# Patient Record
Sex: Female | Born: 1985 | Race: Black or African American | Hispanic: No | Marital: Single | Smoking: Current every day smoker
Health system: Southern US, Community
[De-identification: ages and names within clinical notes are randomized; demographics above are authoritative.]

## PROBLEM LIST (undated history)

## (undated) DIAGNOSIS — F1920 Other psychoactive substance dependence, uncomplicated: Secondary | ICD-10-CM

## (undated) HISTORY — PX: TONSILLECTOMY: SUR1361

## (undated) HISTORY — PX: OTHER SURGICAL HISTORY: SHX169

---
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
## 2005-04-03 ENCOUNTER — Observation Stay: Payer: Self-pay | Admitting: Obstetrics & Gynecology

## 2005-04-11 ENCOUNTER — Observation Stay: Payer: Self-pay | Admitting: Unknown Physician Specialty

## 2005-05-10 ENCOUNTER — Observation Stay: Payer: Self-pay

## 2005-08-26 ENCOUNTER — Emergency Department: Payer: Self-pay | Admitting: Emergency Medicine

## 2006-02-08 ENCOUNTER — Emergency Department: Payer: Self-pay | Admitting: Emergency Medicine

## 2006-08-22 ENCOUNTER — Emergency Department: Payer: Self-pay | Admitting: Emergency Medicine

## 2006-09-08 ENCOUNTER — Emergency Department: Payer: Self-pay | Admitting: Emergency Medicine

## 2006-11-01 ENCOUNTER — Encounter: Payer: Self-pay | Admitting: Maternal and Fetal Medicine

## 2006-11-22 ENCOUNTER — Encounter: Payer: Self-pay | Admitting: Maternal & Fetal Medicine

## 2007-01-06 ENCOUNTER — Observation Stay: Payer: Self-pay

## 2007-01-21 ENCOUNTER — Encounter: Payer: Self-pay | Admitting: Maternal & Fetal Medicine

## 2007-02-06 ENCOUNTER — Observation Stay: Payer: Self-pay | Admitting: Obstetrics and Gynecology

## 2007-02-18 ENCOUNTER — Encounter: Payer: Self-pay | Admitting: Maternal and Fetal Medicine

## 2007-03-09 ENCOUNTER — Observation Stay: Payer: Self-pay

## 2007-03-13 ENCOUNTER — Observation Stay: Payer: Self-pay | Admitting: Obstetrics and Gynecology

## 2007-03-19 ENCOUNTER — Observation Stay: Payer: Self-pay | Admitting: Unknown Physician Specialty

## 2007-03-21 ENCOUNTER — Observation Stay: Payer: Self-pay

## 2007-04-17 ENCOUNTER — Ambulatory Visit: Payer: Self-pay | Admitting: Obstetrics & Gynecology

## 2007-04-18 ENCOUNTER — Inpatient Hospital Stay: Payer: Self-pay | Admitting: Obstetrics & Gynecology

## 2007-11-16 ENCOUNTER — Ambulatory Visit: Payer: Self-pay | Admitting: Family Medicine

## 2008-02-28 ENCOUNTER — Inpatient Hospital Stay: Payer: Self-pay | Admitting: Psychiatry

## 2008-10-21 ENCOUNTER — Emergency Department: Payer: Self-pay | Admitting: Emergency Medicine

## 2009-01-28 ENCOUNTER — Encounter: Payer: Self-pay | Admitting: Obstetrics and Gynecology

## 2009-02-10 ENCOUNTER — Encounter: Payer: Self-pay | Admitting: Obstetrics and Gynecology

## 2009-06-29 ENCOUNTER — Observation Stay: Payer: Self-pay

## 2009-06-30 ENCOUNTER — Observation Stay: Payer: Self-pay

## 2009-08-20 ENCOUNTER — Inpatient Hospital Stay: Payer: Self-pay

## 2009-10-09 ENCOUNTER — Ambulatory Visit: Payer: Self-pay

## 2011-04-01 ENCOUNTER — Emergency Department: Payer: Self-pay | Admitting: Emergency Medicine

## 2012-04-05 LAB — URINALYSIS, COMPLETE
Bilirubin,UR: NEGATIVE
Ph: 5 (ref 4.5–8.0)
Protein: 30
Specific Gravity: 1.032 (ref 1.003–1.030)
Squamous Epithelial: 27

## 2012-04-05 LAB — BASIC METABOLIC PANEL
Co2: 23 mmol/L (ref 21–32)
Creatinine: 1.15 mg/dL (ref 0.60–1.30)
EGFR (African American): >60
EGFR (Non-African Amer.): >60
Glucose: 100 mg/dL — ABNORMAL HIGH (ref 65–99)
Sodium: 138 mmol/L (ref 136–145)

## 2012-04-05 LAB — CSF CELL CT + PROT + GLU PANEL
CSF Tube #: 3
Eosinophil: 0 %
Glucose, CSF: 59 mg/dL (ref 40–75)
Lymphocytes: 67 %
Monocytes/Macrophages: 17 %
Other Cells: 0 %
RBC (CSF): 20 /mm3

## 2012-04-05 LAB — CBC
HCT: 43.7 % (ref 35.0–47.0)
HGB: 14.8 g/dL (ref 12.0–16.0)
MCH: 29.5 pg (ref 26.0–34.0)
MCHC: 33.8 g/dL (ref 32.0–36.0)
MCV: 87 fL (ref 80–100)
RBC: 5.01 10*6/uL (ref 3.80–5.20)
RDW: 15.1 % — ABNORMAL HIGH (ref 11.5–14.5)
WBC: 5.6 10*3/uL (ref 3.6–11.0)

## 2012-04-05 LAB — MONONUCLEOSIS SCREEN: Mono Test: POSITIVE

## 2012-04-06 ENCOUNTER — Inpatient Hospital Stay: Payer: Self-pay | Admitting: Family Medicine

## 2012-04-06 ENCOUNTER — Ambulatory Visit: Payer: Self-pay | Admitting: Neurology

## 2012-04-06 LAB — URINALYSIS, COMPLETE
Bacteria: NONE SEEN
Bilirubin,UR: NEGATIVE
Glucose,UR: NEGATIVE mg/dL (ref 0–75)
RBC,UR: 13 /HPF (ref 0–5)
Specific Gravity: 1.018 (ref 1.003–1.030)
Squamous Epithelial: 4
WBC UR: 24 /HPF (ref 0–5)

## 2012-04-07 LAB — CBC WITH DIFFERENTIAL/PLATELET
Basophil #: 0 10*3/uL (ref 0.0–0.1)
Basophil %: 0.7 %
HGB: 12.9 g/dL (ref 12.0–16.0)
Lymphocyte #: 1.5 10*3/uL (ref 1.0–3.6)
MCH: 29 pg (ref 26.0–34.0)
MCHC: 33.6 g/dL (ref 32.0–36.0)
Monocyte #: 0.5 x10 3/mm (ref 0.2–0.9)
Monocyte %: 11.1 %
Neutrophil #: 2.8 10*3/uL (ref 1.4–6.5)
Platelet: 189 10*3/uL (ref 150–440)
RDW: 14.4 % (ref 11.5–14.5)

## 2012-04-07 LAB — COMPREHENSIVE METABOLIC PANEL
Albumin: 2.7 g/dL — ABNORMAL LOW (ref 3.4–5.0)
Anion Gap: 9 (ref 7–16)
Calcium, Total: 8.1 mg/dL — ABNORMAL LOW (ref 8.5–10.1)
Co2: 22 mmol/L (ref 21–32)
Creatinine: 1.42 mg/dL — ABNORMAL HIGH (ref 0.60–1.30)
EGFR (African American): 59 — ABNORMAL LOW
EGFR (Non-African Amer.): 51 — ABNORMAL LOW
Glucose: 105 mg/dL — ABNORMAL HIGH (ref 65–99)
Osmolality: 275 (ref 275–301)
Potassium: 3 mmol/L — ABNORMAL LOW (ref 3.5–5.1)
SGOT(AST): 55 U/L — ABNORMAL HIGH (ref 15–37)
SGPT (ALT): 45 U/L (ref 12–78)
Sodium: 139 mmol/L (ref 136–145)

## 2012-04-07 LAB — URINE CULTURE

## 2012-04-07 LAB — MAGNESIUM: Magnesium: 1.9 mg/dL

## 2012-04-07 LAB — BETA STREP CULTURE(ARMC)

## 2012-04-08 LAB — BASIC METABOLIC PANEL
Anion Gap: 8 (ref 7–16)
BUN: 3 mg/dL — ABNORMAL LOW (ref 7–18)
Chloride: 108 mmol/L — ABNORMAL HIGH (ref 98–107)
Co2: 23 mmol/L (ref 21–32)
EGFR (Non-African Amer.): 54 — ABNORMAL LOW
Glucose: 89 mg/dL (ref 65–99)
Osmolality: 274 (ref 275–301)

## 2012-04-08 LAB — CSF CULTURE W GRAM STAIN

## 2012-04-10 LAB — URINALYSIS, COMPLETE
Glucose,UR: NEGATIVE mg/dL (ref 0–75)
Leukocyte Esterase: NEGATIVE
Nitrite: NEGATIVE
Protein: NEGATIVE
RBC,UR: 1 /HPF (ref 0–5)
Specific Gravity: 1.005 (ref 1.003–1.030)
WBC UR: 1 /HPF (ref 0–5)

## 2012-04-10 LAB — CBC WITH DIFFERENTIAL/PLATELET
Basophil %: 0.7 %
Eosinophil #: 0 10*3/uL (ref 0.0–0.7)
HCT: 34.8 % — ABNORMAL LOW (ref 35.0–47.0)
HGB: 12.2 g/dL (ref 12.0–16.0)
MCH: 29.8 pg (ref 26.0–34.0)
MCHC: 35 g/dL (ref 32.0–36.0)
Monocyte #: 0.5 x10 3/mm (ref 0.2–0.9)
Neutrophil %: 64.9 %
Platelet: 273 10*3/uL (ref 150–440)
RBC: 4.09 10*6/uL (ref 3.80–5.20)

## 2012-04-10 LAB — BASIC METABOLIC PANEL
Anion Gap: 10 (ref 7–16)
BUN: 1 mg/dL — ABNORMAL LOW (ref 7–18)
Co2: 23 mmol/L (ref 21–32)
Creatinine: 1.07 mg/dL (ref 0.60–1.30)
EGFR (African American): >60
EGFR (Non-African Amer.): >60
Glucose: 89 mg/dL (ref 65–99)
Osmolality: 271 (ref 275–301)
Sodium: 138 mmol/L (ref 136–145)

## 2012-04-11 LAB — URINALYSIS, COMPLETE
Bacteria: NONE SEEN
Glucose,UR: NEGATIVE mg/dL (ref 0–75)
Ketone: NEGATIVE
Nitrite: NEGATIVE
RBC,UR: 1 /HPF (ref 0–5)
Specific Gravity: 1.005 (ref 1.003–1.030)
WBC UR: 1 /HPF (ref 0–5)

## 2012-04-11 LAB — CBC WITH DIFFERENTIAL/PLATELET
Basophil %: 1.2 %
Eosinophil #: 0.1 10*3/uL (ref 0.0–0.7)
Eosinophil %: 1.6 %
HGB: 12.9 g/dL (ref 12.0–16.0)
Lymphocyte %: 33.8 %
MCH: 29.8 pg (ref 26.0–34.0)
MCHC: 35.4 g/dL (ref 32.0–36.0)
MCV: 84 fL (ref 80–100)
Monocyte #: 0.6 x10 3/mm (ref 0.2–0.9)
Monocyte %: 12.3 %
Neutrophil %: 51.1 %
Platelet: 331 10*3/uL (ref 150–440)
RBC: 4.34 10*6/uL (ref 3.80–5.20)
WBC: 4.9 10*3/uL (ref 3.6–11.0)

## 2012-04-17 LAB — CULTURE, BLOOD (SINGLE)

## 2013-03-30 ENCOUNTER — Emergency Department: Payer: Self-pay | Admitting: Emergency Medicine

## 2013-03-30 LAB — CBC
HGB: 14.7 g/dL (ref 12.0–16.0)
MCH: 30.7 pg (ref 26.0–34.0)
Platelet: 273 10*3/uL (ref 150–440)
RDW: 16.8 % — ABNORMAL HIGH (ref 11.5–14.5)

## 2013-03-30 LAB — COMPREHENSIVE METABOLIC PANEL
BUN: 6 mg/dL — ABNORMAL LOW (ref 7–18)
Calcium, Total: 8.9 mg/dL (ref 8.5–10.1)
Co2: 23 mmol/L (ref 21–32)
EGFR (Non-African Amer.): >60
Osmolality: 272 (ref 275–301)
Potassium: 3.5 mmol/L (ref 3.5–5.1)
Sodium: 138 mmol/L (ref 136–145)

## 2013-03-30 LAB — CK TOTAL AND CKMB (NOT AT ARMC)
CK, Total: 92 U/L (ref 21–215)
CK-MB: <0.5 ng/mL — ABNORMAL LOW (ref 0.5–3.6)

## 2013-04-17 ENCOUNTER — Inpatient Hospital Stay: Payer: Self-pay | Admitting: Psychiatry

## 2013-04-17 LAB — CBC
HCT: 41.3 % (ref 35.0–47.0)
MCH: 31 pg (ref 26.0–34.0)
MCV: 89 fL (ref 80–100)
RDW: 16.7 % — ABNORMAL HIGH (ref 11.5–14.5)

## 2013-04-17 LAB — DRUG SCREEN, URINE
Benzodiazepine, Ur Scrn: NEGATIVE (ref ?–200)
Methadone, Ur Screen: NEGATIVE (ref ?–300)
Phencyclidine (PCP) Ur S: NEGATIVE (ref ?–25)

## 2013-04-17 LAB — URINALYSIS, COMPLETE
Glucose,UR: NEGATIVE mg/dL (ref 0–75)
Ketone: NEGATIVE
Nitrite: NEGATIVE
Protein: NEGATIVE
Specific Gravity: 1.017 (ref 1.003–1.030)

## 2013-04-17 LAB — COMPREHENSIVE METABOLIC PANEL
Alkaline Phosphatase: 90 U/L (ref 50–136)
Anion Gap: 5 — ABNORMAL LOW (ref 7–16)
Bilirubin,Total: 0.5 mg/dL (ref 0.2–1.0)
Chloride: 104 mmol/L (ref 98–107)
Co2: 28 mmol/L (ref 21–32)
Creatinine: 1.12 mg/dL (ref 0.60–1.30)
Osmolality: 271 (ref 275–301)
Potassium: 3.6 mmol/L (ref 3.5–5.1)
SGPT (ALT): 103 U/L — ABNORMAL HIGH (ref 12–78)
Sodium: 137 mmol/L (ref 136–145)
Total Protein: 7.7 g/dL (ref 6.4–8.2)

## 2013-04-17 LAB — ETHANOL: Ethanol: <3 mg/dL

## 2013-04-20 LAB — HEPATIC FUNCTION PANEL A (ARMC)
Alkaline Phosphatase: 65 U/L (ref 50–136)
Bilirubin, Direct: 0.1 mg/dL (ref 0.00–0.20)
Bilirubin,Total: 0.3 mg/dL (ref 0.2–1.0)
SGOT(AST): 34 U/L (ref 15–37)

## 2013-08-05 ENCOUNTER — Emergency Department: Payer: Self-pay | Admitting: Emergency Medicine

## 2013-08-05 LAB — URINALYSIS, COMPLETE
BUDDING YEAST: NONE SEEN
Glucose,UR: NEGATIVE mg/dL (ref 0–75)
Ketone: NEGATIVE
Leukocyte Esterase: NEGATIVE
Nitrite: NEGATIVE
PROTEIN: NEGATIVE
Ph: 6 (ref 4.5–8.0)
SPECIFIC GRAVITY: 1.017 (ref 1.003–1.030)
Squamous Epithelial: NONE SEEN
WBC Clump: NONE SEEN
WBC UR: 9 /HPF (ref 0–5)

## 2013-08-05 LAB — COMPREHENSIVE METABOLIC PANEL
ANION GAP: 3 — AB (ref 7–16)
AST: 27 U/L (ref 15–37)
Albumin: 3.9 g/dL (ref 3.4–5.0)
Alkaline Phosphatase: 50 U/L
BUN: 9 mg/dL (ref 7–18)
Bilirubin,Total: 0.3 mg/dL (ref 0.2–1.0)
CHLORIDE: 104 mmol/L (ref 98–107)
CO2: 30 mmol/L (ref 21–32)
CREATININE: 1.09 mg/dL (ref 0.60–1.30)
Calcium, Total: 9.1 mg/dL (ref 8.5–10.1)
Glucose: 78 mg/dL (ref 65–99)
Osmolality: 271 (ref 275–301)
Potassium: 3.7 mmol/L (ref 3.5–5.1)
SGPT (ALT): 25 U/L (ref 12–78)
Sodium: 137 mmol/L (ref 136–145)
Total Protein: 8 g/dL (ref 6.4–8.2)

## 2013-08-05 LAB — CBC WITH DIFFERENTIAL/PLATELET
Basophil #: 0.1 10*3/uL (ref 0.0–0.1)
Basophil %: 1.3 %
Eosinophil #: 0.1 10*3/uL (ref 0.0–0.7)
Eosinophil %: 1.4 %
HCT: 44.3 % (ref 35.0–47.0)
HGB: 15.3 g/dL (ref 12.0–16.0)
LYMPHS ABS: 2.2 10*3/uL (ref 1.0–3.6)
Lymphocyte %: 30 %
MCH: 31.7 pg (ref 26.0–34.0)
MCHC: 34.5 g/dL (ref 32.0–36.0)
MCV: 92 fL (ref 80–100)
MONO ABS: 0.6 x10 3/mm (ref 0.2–0.9)
Monocyte %: 7.5 %
Neutrophil #: 4.4 10*3/uL (ref 1.4–6.5)
Neutrophil %: 59.8 %
Platelet: 239 10*3/uL (ref 150–440)
RBC: 4.81 10*6/uL (ref 3.80–5.20)
RDW: 14.7 % — ABNORMAL HIGH (ref 11.5–14.5)
WBC: 7.3 10*3/uL (ref 3.6–11.0)

## 2013-08-05 LAB — LIPASE, BLOOD: LIPASE: 94 U/L (ref 73–393)

## 2013-08-27 ENCOUNTER — Emergency Department: Payer: Self-pay | Admitting: Emergency Medicine

## 2013-08-27 LAB — COMPREHENSIVE METABOLIC PANEL
ALK PHOS: 45 U/L
ALT: 17 U/L (ref 12–78)
ANION GAP: 11 (ref 7–16)
Albumin: 3.8 g/dL (ref 3.4–5.0)
BILIRUBIN TOTAL: 0.3 mg/dL (ref 0.2–1.0)
BUN: 5 mg/dL — ABNORMAL LOW (ref 7–18)
Calcium, Total: 8.2 mg/dL — ABNORMAL LOW (ref 8.5–10.1)
Chloride: 105 mmol/L (ref 98–107)
Co2: 22 mmol/L (ref 21–32)
Creatinine: 1.08 mg/dL (ref 0.60–1.30)
Glucose: 74 mg/dL (ref 65–99)
Osmolality: 272 (ref 275–301)
Potassium: 3.4 mmol/L — ABNORMAL LOW (ref 3.5–5.1)
SGOT(AST): 29 U/L (ref 15–37)
SODIUM: 138 mmol/L (ref 136–145)
Total Protein: 7.4 g/dL (ref 6.4–8.2)

## 2013-08-27 LAB — ACETAMINOPHEN LEVEL: Acetaminophen: <2 ug/mL

## 2013-08-27 LAB — DRUG SCREEN, URINE
Amphetamines, Ur Screen: NEGATIVE (ref ?–1000)
BARBITURATES, UR SCREEN: NEGATIVE (ref ?–200)
Benzodiazepine, Ur Scrn: NEGATIVE (ref ?–200)
Cannabinoid 50 Ng, Ur ~~LOC~~: POSITIVE (ref ?–50)
Cocaine Metabolite,Ur ~~LOC~~: POSITIVE (ref ?–300)
MDMA (Ecstasy)Ur Screen: NEGATIVE (ref ?–500)
Methadone, Ur Screen: NEGATIVE (ref ?–300)
OPIATE, UR SCREEN: NEGATIVE (ref ?–300)
Phencyclidine (PCP) Ur S: NEGATIVE (ref ?–25)
Tricyclic, Ur Screen: NEGATIVE (ref ?–1000)

## 2013-08-27 LAB — CBC
HCT: 44.2 % (ref 35.0–47.0)
HGB: 15 g/dL (ref 12.0–16.0)
MCH: 31.4 pg (ref 26.0–34.0)
MCHC: 33.9 g/dL (ref 32.0–36.0)
MCV: 93 fL (ref 80–100)
PLATELETS: 241 10*3/uL (ref 150–440)
RBC: 4.77 10*6/uL (ref 3.80–5.20)
RDW: 15.4 % — AB (ref 11.5–14.5)
WBC: 7.1 10*3/uL (ref 3.6–11.0)

## 2013-08-27 LAB — SALICYLATE LEVEL: Salicylates, Serum: 2.7 mg/dL

## 2013-08-27 LAB — ETHANOL
Ethanol %: 0.191 % — ABNORMAL HIGH (ref 0.000–0.080)
Ethanol: 191 mg/dL

## 2014-09-29 NOTE — H&P (Signed)
PATIENT NAME:  Angel Ortiz, Angel Ortiz MR#:  [id number] DATE OF BIRTH:  [birth year]  DATE OF ADMISSION:  04/06/2012  REFERRING PHYSICIAN: Dr. Clemens Catholic  PRIMARY CARE PHYSICIAN: None  HISTORY OF PRESENT ILLNESS: This is a 29 year old female without significant past medical history who presents with complaints of fever, chills, generalized body aches, headache, and neck tenderness over the last few days. The patient was febrile to 103.9 in the Emergency Department, was tachycardic and hypotensive initially. The patient had positive meningeal signs so the patient had an LP done by interventional radiology under fluoroscopy with CSF showing 59 white blood cells, 67% lymphocytes, with glucose of 59 and protein of 33. As well the patient's mono test came back positive. The patient denies any sick contacts. She denies any sensitivity to light. Denies any vomiting but complains of nausea, neck pain and stiffness, and generalized weakness without any focal deficits, tingling, numbness, or dizziness. The patient's CT of the brain did not show any acute findings. The patient was hypotensive where she did require fluid boluses. On my examination blood pressure was 76/40. The patient so far received 4 liters of normal saline fluid boluses. The patient was started on IV acyclovir for aseptic meningitis empirically.   PAST MEDICAL HISTORY: Depression.   PAST SURGICAL HISTORY: C-section.   HOME MEDICATIONS: None.   FAMILY HISTORY: The patient is adopted and does not know if there is any family history in her biological parents.   SOCIAL HISTORY: The patient is unemployed, smoking 1 pack per day. Denies any alcohol or drug abuse.   REVIEW OF SYSTEMS:  The patient complains of fever, fatigue, and weakness. EYES: Denies blurry vision, double vision, or pain. ENT: Denies tinnitus, ear pain, or hearing loss.  RESPIRATORY: Denies cough, wheezing, hemoptysis, dyspnea, or painful respirations. CARDIOVASCULAR:  Denies chest pain,  orthopnea, edema, arrhythmia, or palpitation.  GI: Complains of nausea. Denies vomiting, diarrhea, abdominal pain, or hematemesis.  GU: Denies dysuria, hematuria, or renal colic. ENDO: Denies polyuria, polydipsia, or heat or cold intolerance. HEMATOLOGIC: Denies anemia, easy bruising, or bleeding diathesis. INTEGUMENT: Denies acne, rash, or lesions. MUSCULOSKELETAL: Complains of neck tenderness and pain. Denies any knee pain, shoulder pain, arthritis, cramps, swelling, or gout.  NEURO: Denies any numbness, dysarthria, epilepsy, tremors, or vertigo. Complains of headache. PSYCH: Has history of depression. Denies any current suicidal ideation or thoughts. Denies any schizophrenia, nervousness, or anxiety.  No substance or alcohol abuse.   PHYSICAL EXAMINATION:  VITAL SIGNS: Temperature 99.9, temperature max 103.9, pulse 102, respiratory rate 16, blood pressure 100/53, saturating 97% on room air.   GENERAL: Ill-appearing young female in bed in no apparent distress.   HEENT: Head atraumatic, normocephalic. Pupils equal, reactive to light. Pink conjunctivae. Anicteric sclerae. Moist oral mucosa.   NECK: No thyromegaly. No JVD.   CHEST: Good air entry bilaterally. No wheezing, rales, or rhonchi.   CARDIOVASCULAR: S1, S2 heard. No rubs, murmur, or gallops. Tachycardic but regular rhythm.   ABDOMEN: Soft, nontender, nondistended. Bowel sounds present.   EXTREMITIES: No edema. No clubbing. No cyanosis.   PSYCHIATRIC: Appropriate affect. Awake, alert, oriented times three. Intact judgment and insight.   NEUROLOGIC: Grossly intact. Motor five out of five. Normal sensation. Symmetrical bilaterally.   MUSCULOSKELETAL: The patient had neck tenderness to palpation with positive meningeal signs.   PERTINENT LABS: Glucose 100, BUN 5, creatinine 1.15, sodium 138, potassium 3.3, chloride 106, CO2 23, anion gap 9, white blood cells 5.6, hemoglobin 14.8, hematocrit 43.7, platelets 215. CSF analysis showing  white blood cells 59, red blood cells of 20, lymphocytes of 67%, neutrophils 17, monocytes 17, white blood cells 59, glucose 59, and protein 33. Urinalysis is showing leukocytes esterase +2, 26 white blood cells, bacteria +1, as well as 27 epithelial cells. Mono test positive. CT of the brain showing no evidence of acute intracranial abnormalities.   ASSESSMENT AND PLAN: This is a 29 year old female who presents with sepsis as she is febrile, tachycardic, and hypotensive. Her sepsis is most likely related to aseptic meningitis versus mono. 1. Sepsis: The patient is tachycardic, febrile, and hypotensive. We will continue with fluid resuscitation. We will admit to the Intensive Care Unit. Etiology most likely appears to be due to aseptic meningitis, but as well the patient has infectious mononucleosis. We will need to repeat her urinalysis as the first specimen was contaminated.  2. Aseptic meningitis: The patient was started on IV acyclovir. Showing 59 white blood cells which are predominantly lymphocytes. Findings are most suggestive of aseptic meningitis. This is most likely due to complication of her infectious mononucleosis so HSV cultures were sent and EPV cultures were sent and we will follow up on the results. We will consult ID physicians as well.   3. Infectious mononucleosis:  The patient was consulted with her mother to avoid any contact sports or aggressive physical activity. We will continue with supportive care including analgesics, antibiotics, and volume support.  4. Hypokalemia: We will replace.  5. Tobacco abuse was counseled. Will continue on NicoDerm patch.  6. CODE STATUS: The patient is FULL CODE. 7. Deep vein thrombosis prophylaxis: We will continue with sequential compression device. No chemical anticoagulation currently due to recent LP.   TOTAL TIME SPENT ON ADMISSION AND PATIENT CARE: 55 minutes.   ____________________________ Starleen Armsawood S. Elgergawy, MD dse:bjt D: 04/06/2012  03:17:37 ET T: 04/06/2012 09:45:56 ET JOB#: [id number]  cc: Starleen Armsawood S. Elgergawy, MD, <Dictator> DAWOOD Teena IraniS ELGERGAWY MD ELECTRONICALLY SIGNED 04/12/2012 2:21

## 2014-09-29 NOTE — Consult Note (Signed)
PATIENT NAME:  Angel Ortiz, Angel Ortiz MR#:  [id number] DATE OF BIRTH:  04-29-1986  DATE OF CONSULTATION:  04/06/2012  REFERRING PHYSICIAN:   CONSULTING PHYSICIAN:  Pauletta Browns, MD  REASON FOR CONSULTATION: Altered mental status, confusion and suspected meningitis.   HISTORY OF PRESENT ILLNESS: This is a pleasant 29 year old female with no significant past medical history admitted with a four day history of fever, chills, generalized body aches and diffuse pressure headache. Patient has also been complaining of neck tenderness for about two to three days prior to admission. On admission she was found to be febrile of temperature 103.9, tachycardic and hypotensive with systolic blood pressure in the mid 70s. On admission there was suspected that patient had meningeal signs and she started acyclovir, vancomycin and ceftriaxone for meningitis prophylaxis. Overnight she has responded to multiple fluid boluses and her blood pressure has significantly improved. She is status post lumbar puncture that showed 59 WBCs, 67% which were lymphocytes with normal glucose and protein and so far on culture no organisms have been grown. Patient has also been tested for mono which came back positive. She denies any recent sick contacts or any recent travel. There is no nausea, no vomiting. Does complain of mild neck stiffness but it is mostly due to pain that is bilaterally on the lateral aspects of her neck as well as diffuse headache.   PAST MEDICAL HISTORY: Depression.  PAST SURGICAL HISTORY: She has a history of C-section.  HOME MEDICATIONS: None.   FAMILY HISTORY: Noncontributory because patient is adopted, does not know her biological parents.   SOCIAL HISTORY: Patient is unemployed. Smokes one pack per day.   LABORATORY, DIAGNOSTIC AND RADIOLOGICAL DATA: Glucose 97. Cerebrospinal fluid results as described earlier but she is found to have in tube #3 20 RBCs, 59 WBCs, neutrophils 17. This tap was lymphocyte  predominant of 67% and gram stain so far no organisms have been seen. Patient is status post CT head without contrast no acute findings were noted.   PHYSICAL EXAMINATION: VITAL SIGNS: Temperature 97.9, pulse 84, blood pressure 104/51, pulse ox 98%.   NEUROLOGICAL: Patient is awake, alert, oriented x3. She is able to tell me her name, date, time and location. She is able to follow all my commands. On cranial nerve examination there are no visual deficits. Extraocular movements appear to be intact. Facial sensation appears intact. Hearing is intact bilaterally. Tongue is midline and palate elevated symmetrically. On motor examination there is very mild neck stiffness. Meningeal signs appear to be negative at this point. The patient is complaining of diffuse headache. Motor strength upper extremities is 4+/5 bilateral upper and lower extremities but patient is complaining of generalized weakness. The tone is symmetrical. No rigidity or spasticity. Reflexes are 2+. Sensation is intact to light touch and temperature bilateral upper and lower extremities. Coordination, finger to nose intact. Gait could not be assessed.   IMPRESSION: This is a 28 year old female presenting with four day history of generalized weakness, fatigue, fevers and she also presented with tachycardia, hypotension which resolved with fluids. Currently she is on acyclovir for suspicion of herpes encephalitis as well as vancomycin and ceftriaxone for bacterial mengismus and prophylaxis. Patient was also found to be positive for mono but we don't have history of this IgM or IgG and if this is chronic or acute infection. Usually mononucleosis does not present with elevated WBCs in the cerebrospinal fluid.   PLAN: At this point I don't think we need antibiotic management with ceftriaxone and vancomycin  as patient does not appear to have bacterial meningitis but would defer this also to infectious disease. Would test patient for HIV, test for EBV  IgM and AgG. Await results of cerebrospinal fluid PCR for herpes encephalitis and until that is negative will continue acyclovir management.  At this point her condition is most likely due to aseptic meningitis with contribution of mononucleosis. Both of those are treated as symptomatic management and hydration.   Thank you. It was a pleasure seeing this patient.   ____________________________ Pauletta BrownsYuriy Borna Wessinger, MD yz:cms D: 04/06/2012 12:37:49 ET T: 04/06/2012 13:24:27 ET  JOB#: [id number] cc: Pauletta BrownsYuriy Zakai Gonyea, MD, <Dictator> Pauletta BrownsYURIY Adina Puzzo MD ELECTRONICALLY SIGNED 05/28/2012 19:07

## 2014-09-29 NOTE — Consult Note (Signed)
PATIENT NAME:  Angel Ortiz, Angel Ortiz#:  [id number] DATE OF BIRTH:  1985/08/12  DATE OF CONSULTATION:  04/08/2012  REFERRING PHYSICIAN:  Dr. Elpidio AnisSudini  CONSULTING PHYSICIAN:  Rosalyn GessMichael E. Ianmichael Amescua, MD  REASON FOR CONSULTATION: Aseptic meningitis.   HISTORY OF PRESENT ILLNESS: The patient is a 29 year old black female without significant past medical history who was admitted on 10/26 with approximately one-week history of malaise, headache and neck stiffness. The patient denied any fevers at home, but did have chills and sweats as well as generalized malaise. Apparently when she arrived in the Emergency Room, however, she had a temperature of 103.9 and was hypotensive. She had meningeal signs and a lumbar puncture was performed. She had 59 white cells and 20 red cells. She had 17% neutrophils and 67% lymphocytes and 17% monocytes in the CSF. Her glucose and protein were normal. Cultures have ultimately been negative. She was initially treated with acyclovir, vancomycin and ceftriaxone with negative cultures, only the acyclovir has been continued. HSV PCR is currently pending. She continues to have headaches but no further fevers. She has photophobia and is still generally weak.   ALLERGIES: None.   PAST MEDICAL HISTORY:  1. Depression.  2. Status post C-section x3.   SOCIAL HISTORY: The patient lives with her mother and three children, ages 792, 64 and 106. None of them has been sick at home. She smokes a pack of cigarettes per day. She denies any alcohol or injecting drug use.   FAMILY HISTORY: The patient is adopted.   REVIEW OF SYSTEMS: GENERAL: No fevers. Positive chills and sweats. Positive malaise. Positive generalized weakness and fatigue. HEENT: Positive headache. No sinus congestion. No sore throat. NECK: Positive stiffness. No swollen glands. RESPIRATORY: No cough or shortness of breath. No sputum production. CARDIAC: No chest pains or palpitations. GASTROINTESTINAL: She has had some nausea and vomiting  and continues with this still. No abdominal pain, no change in her bowels. GENITOURINARY: No change in her urine. MUSCULOSKELETAL: She has generalized achiness but no frank joint inflammation. SKIN: No rashes. NEUROLOGIC: She has stiff neck, but has not had any confusion. PSYCHIATRIC: No complaints. All other systems are negative.   PHYSICAL EXAMINATION:  VITAL SIGNS: T-max of 100 although she was over 103 in the Emergency Room. T-current 99.7, pulse 98, blood pressure 102/62, 97% on room air.   GENERAL: A thin 29 year old black female in no acute distress, but appears moderately uncomfortable.   HEENT: Pupils are equal and reactive to light. Extraocular motion appeared to be intact. Sclerae, conjunctivae, and lids without evidence for emboli or petechiae. Oropharynx shows no erythema or exudate. Teeth and gums are in good condition.   NECK: She had neck stiffness with positive Brudzinski sign and Kernig sign. Midline trachea. No lymphadenopathy. No thyromegaly.   LUNGS: Clear to auscultation bilaterally with good air movement. No focal consolidation.   HEART: Regular rate and rhythm without murmur, rub, or gallop.   ABDOMEN: Soft, nontender, and nondistended. No hepatosplenomegaly. No hernia is noted.   EXTREMITIES: No evidence for tenosynovitis.   SKIN: No rashes. No stigmata of endocarditis, specifically no Janeway lesions or Osler nodes.   NEUROLOGIC: The patient was awake and interactive but was uncomfortable. She has neck stiffness and positive meningeal signs. She was moving all four extremities however.   PSYCHIATRIC: Mood and affect appeared normal, albeit uncomfortable.   LABORATORY, RADIOLOGICAL AND DIAGNOSTIC DATA: BUN 3, creatinine 1.36, bicarbonate 23, anion gap of 8. LFTs showed an AST of 55, ALT 45, alkaline  phosphatase 60, total bilirubin of 0.2. White count was 4.8 with a hemoglobin of 12.9, platelet count 189, ANC of 2.8. White count on admission was 5.6. CSF studies  showed 20 red cells, 59 white cells, 17% of which were neutrophils, 67% lymphocytes and 17% monocytes. Glucose was 59, protein was 33. Strep culture was negative. CSF culture is negative. A urinalysis had negative nitrites, 2+ leukocyte esterase, 68 red cells and 26 white cells. A mono spot was positive. A CT scan of the head without contrast showed no evidence of intracranial abnormalities.   IMPRESSION: A 29 year old black female without significant past medical history who is admitted with aseptic meningitis.   RECOMMENDATIONS:  1. Her CSF cultures are negative. She likely has a viral etiology. Her Monospot was positive, but she does not have sore throat or lymphadenopathy. Epstein-Barr virus can cause aseptic meningitis and it is very possible that this is the etiology. Anyone without classic symptoms, however, should have the diagnosis confirmed.  2. We will send Epstein-Barr virus serology and an Epstein-Barr virus PCR to document acute infection.  3. HSV is another possibility. She is receiving acyclovir. As HSV meningitis resolves without treatment, I am not sure that acyclovir is necessary. It may shorten the course however. Will continue the acyclovir until the HSV PCR returns.  4. Will send HIV PCR and antibodies. This also can cause meningitis.  5. Will add a VDRL to the CSF and get a peripheral RPR.   This is a moderately complex infectious disease consult. Thank you very much for involving me in Angel Ortiz' care.   ____________________________ Rosalyn Gess. Rossana Molchan, MD meb:ap D: 04/08/2012 17:14:23 ET T: 04/09/2012 08:37:52 ET JOB#: [id number]  cc: Rosalyn Gess. Annaleigha Woo, MD, <Dictator> Keylee Shrestha E Tahjae Clausing MD ELECTRONICALLY SIGNED 04/12/2012 10:41

## 2014-09-29 NOTE — Discharge Summary (Signed)
PATIENT NAME:  Angel Ortiz, Angel Ortiz MR#:  [id number] DATE OF BIRTH:  [birth year]  DATE OF ADMISSION:  04/06/2012 DATE OF DISCHARGE:  04/12/2012   REASON FOR ADMISSION:  1. Sepsis with hypotension. 2. Possible meningitis/aseptic meningitis.   FINAL DIAGNOSES:  1. Aseptic meningitis. 2. Sepsis, resolved.  3. Infectious mononucleosis.  4. Hypokalemia.  5. Tobacco abuse.   DISPOSITION: Home.   FOLLOW-UP: Follow-up with primary care physician. The patient states that she does not have a specific primary care physician but she has her family doctor who she can visit. Recommendation is to follow-up within 1 to 2 weeks. Recommendation is to be referred to neurologist if the headache persists.   MEDICATIONS AT DISCHARGE:  1. Norco 5/325 p.r.n. pain. 2. Ibuprofen 600 mg p.r.n. pain. 3. Zofran 4 mg p.r.n. pain.   HOSPITAL COURSE: Ms. Swiss is a nice 29 year old female who was pretty much healthy presented to the ER with fever, chills, and generalized body aches. She had a significant headache and neck tenderness for several days and she was febrile up to 103.9. In the Emergency Department the patient was very tachycardic and hypotensive initially. Fluids were given. An LP was done showing 59 white blood cells, 60% lymphocytes, and glucose of 59 with protein of 33. The patient had a positive mono test. The patient denies any sick contacts. She is very sensitive to light.   The patient was admitted and started on acyclovir due to aseptic meningitis. The patient remained complaining of headaches for a long time. She was initially also on vancomycin and ceftriaxone and they were stopped due to the likelihood of this being a bacterial process. The patient developed acute renal failure due to sepsis and dehydration and it corrected with IV fluids. Her blood pressure finally resolved and she was kept on observation during this admission for possible encephalopathy. As far as her lab work, she has normal glucose and  decreased BUN. Her creatinine was up to 1.4 and the last one at discharge is 1.07. Her potassium was low at 3.0 and it was replaced. Other electrolytes were within normal limits. Her LFTs showed decreased protein and elevated AST at 55 and her white cells were always normal around 4 to 5. Her hemoglobin was 12.9. Normal platelets. CSF as mentioned above showed 59 white blood cells, 33 protein, lymphocytes 60%. CSF cultures did not grow anything. An I and O catheter for urinary tract infection did not grow anything as well. That was done as the patient spiked a fever during this hospitalization and her white blood cells were elevated on the urinalysis. Epstein-Barr virus test was negative on CSF. RPR was nonreactive. HIV antibodies were negative. HSV 1 and 2 were negative. VDRL on CSF was negative. Mono test, as mentioned above, was positive.   The patient was evaluated also by Dr. Leavy Cella who was the Infectious Disease on the case and he estimated that she did not require more acyclovir after the PCA was back. The patient still complains of headache even at discharge but she feels like she can go home and deal with it. She is going to be discharged with pain medications.   Recommendations have been given to come back if develops more fevers or problems and if the headache persists she needs to follow-up with her primary care physician.   CONDITION ON DISCHARGE: The patient is discharged today in good condition.   We discussed the possibility of having a blood patch today to help with the headache but the  patient did not feel comfortable doing that. She prefers to go home and rest there.   TIME SPENT: I spent about 45 minutes with this discharge.   ____________________________ Felipa Furnaceoberto Sanchez Gutierrez, MD rsg:drc D: 04/12/2012 08:49:59 ET T: 04/12/2012 09:26:04 ET JOB#: [id number]  cc: Felipa Furnaceoberto Sanchez Gutierrez, MD, <Dictator> Quetzali Heinle Juanda ChanceSANCHEZ GUTIERRE MD ELECTRONICALLY SIGNED 04/15/2012 21:01

## 2014-09-29 NOTE — Consult Note (Signed)
Impression: 29yo BF w/o sig PMHx admitted with aseptic meningitis.  Her CSF cultures are negative.  She likely has a viral etiology.  Her monospot was positive, but she does not have sore throat or LAN.  EBV can cause aseptic meningitis and it is very possible that this is the etiology.  Anyone without classic symptoms should have the diagnosis confirmed however. Will send EBV serology and EBV PCR to document acute infection. HSV is another possibility.  She is receiving acyclovir.  As HSV meningitis resolves without treatment, I am not sure that acyclovir is necessary.  It may shorten the course, however.  Will continue the acyclovir until the HSV PCR returns. Will send HIV PCR and antibody as this can cause meningitis. Will add VDRL to CSF and get peripheral RPR.  Electronic Signatures: Lennex Pietila, Rosalyn GessMichael E (MD)  (Signed on 28-Oct-13 17:05)  Authored  Last Updated: 28-Oct-13 17:05 by Zephyr Ridley, Rosalyn GessMichael E (MD)

## 2014-10-02 NOTE — Discharge Summary (Signed)
PATIENT NAME:  Angel Ortiz, Angel Ortiz MR#:  [id number] DATE OF BIRTH:  January 24, 1986  DATE OF ADMISSION:  04/17/2013 DATE OF DISCHARGE:  04/28/2013  HOSPITAL COURSE: See dictated history and physical for details of admission. A 29 year old woman with a history of polysubstance dependence, mostly alcohol, was admitted to the hospital expressing suicidal ideation and also in need of alcohol withdrawal. She was treated in the hospital for detox. She had a mildly complicated detox, but did not become fully delirious. Eventually she was able to taper off of medications and she did participate much more appropriately. Attended groups, interacted with others, and attended individual therapy. Showed insight into her substance abuse problem. She was treated with medications for her depression and mood stability and seemed to tolerate these. She was very agreeable to going to the alcohol and drug abuse treatment center in [place]. At the time of discharge, she was not actively reporting hallucinations anymore or terrible nightmares, although she still had occasional bad dreams. She was not reporting active suicidal ideation. Plan was for discharge under involuntary commitment to the alcohol and drug abuse treatment center for further substance abuse followup.   DISCHARGE MEDICATIONS: Prazosin 2 mg at night, fluoxetine 20 mg a day, trazodone 200 mg at night, aripiprazole 5 mg a day, omeprazole 20 mg twice a day.  LABORATORY AND DIAGNOSTICS: Admission labs include EKG which showed A sinus tachycardia. CKs were normal. CBC: Slightly elevated white count at 11.2, otherwise normal. Chemistry panel normal. Follow-up EKG normal. Chest x-ray unremarkable. Chemistry panel with slightly elevated ALT and AST, otherwise unremarkable. TSH a little low at 0.332. Urinalysis positive for likely infection, which was treated prior to discharge with Septra for several days. Pregnancy test negative. Drug screen positive for cocaine.   MENTAL STATUS  EXAM AT DISCHARGE: Neatly dressed and groomed young woman who looks her stated age and cooperative with the interview. Good eye contact. Normal psychomotor activity. Speech normal rate, tone, and volume. Affect still slightly anxious, but reactive and appropriate. Mood stated as okay. Thoughts are lucid without loosening of associations. No obvious delusions. Denies auditory or visual hallucinations. Denies suicidal or homicidal ideation. Shows adequate judgment and insight. Normal intelligence. Alert and oriented x 4.   DISPOSITION: Transfer to the alcohol and drug abuse treatment center under involuntary commitment.   DIAGNOSIS, PRINCIPAL AND PRIMARY:  AXIS I: Alcohol dependence.   SECONDARY DIAGNOSES: AXIS I:  1.  Cocaine dependence.  2.  Major depression, severe, with psychotic features.  3.  Rule out posttraumatic stress disorder.  AXIS II: Deferred.  AXIS III: Urinary tract infection, treated and resolved.  AXIS IV: Severe.  AXIS V: Functioning at time of transfer 50. ____________________________ Audery AmelJohn T. Clapacs, MD jtc:sb D: 04/29/2013 14:46:59 ET T: 04/29/2013 16:00:54 ET JOB#: [id number]  cc: Audery AmelJohn T. Clapacs, MD, <Dictator> Audery AmelJOHN T CLAPACS MD ELECTRONICALLY SIGNED 04/30/2013 19:22

## 2014-10-02 NOTE — H&P (Signed)
PATIENT NAME:  Angel Ortiz, Angel Ortiz MR#:  [id number] DATE OF BIRTH:  Sep 22, 1985  DATE OF ADMISSION:  04/17/2013  IDENTIFYING INFORMATION AND CHIEF COMPLAINT: A 29 year old woman brought to the hospital under IVC for suicidal ideation and heavy substance abuse.   CHIEF COMPLAINT: "I want to kill myself."   HISTORY OF PRESENT ILLNESS: Information obtained from the patient and the chart. The patient says that she has been having frequent suicidal ideation, feels like her life is out of control, feels like she is going crazy. She frequently feels like she is hearing voices, especially when she is intoxicated. She drinks about a fifth of liquor every day, the last drink the day before yesterday. Smokes crack cocaine at least every other day. Mood is depressed all the time. Sleep schedule is chaotic with sleep mostly in the daytime. Energy level is poor. Positive suicidal thoughts, positive hopelessness. The fact that she has young children has not stopped her from trying to kill herself. Not getting any kind of outpatient treatment at all currently. Minimal support.   PAST PSYCHIATRIC HISTORY: She has had 1 previous hospitalization in 2009. At that time, she was also having depression and substance abuse problems. She was referred to follow up with [facility] and did not do so. She has never been on any medicine for depression or anxiety, has not had any other inpatient or outpatient substance treatment. She has tried to cut her wrists twice in the last couple of weeks, also fairly recently tried to hang herself, but her mother stopped her.   SUBSTANCE ABUSE HISTORY: Has been abusing substances regularly for years. Drinks about a fifth of liquor every day to 2 days, also smoking crack cocaine every day or 2 days. Denies any history of seizures or delirium tremens. Has tried to stop on her own, but has not been able to do it successfully. Not engaging in any substance abuse treatment.   FAMILY HISTORY: Biological  family history positive for substance abuse.   SOCIAL HISTORY: She graduated from high school and took a few college classes, but has no college degree. Has worked a few jobs but has not been able to work in years because she cannot stay sober. She has 3 children, ages 18, 26 and 3. The patient lives with her adoptive mother. The patient is not working, has no income.   PAST MEDICAL HISTORY: Other than having the children has no significant ongoing medical problems.   CURRENT MEDICATIONS: None.   ALLERGIES: No known drug allergies.   REVIEW OF SYSTEMS: Depressed, suicidal thoughts, feeling like she is going crazy. Sleep is chaotic. Appetite decreased. Positive suicidal ideation. No homicidal ideation. Intermittent hallucinations, mostly while intoxicated.   MENTAL STATUS EXAMINATION: Neatly groomed woman who looks her stated age, cooperative with the interview. Eye contact intermittent. Psychomotor activity restrained but not overly fidgety. Speech is decreased in total amount and soft, but easy to understand. Affect a little bit blunted. Mood stated as depressed. Thoughts overall lucid and logical. No evidence of loose delusional thinking or loosening of associations. Denies auditory or visual hallucinations currently. Denies any homicidal ideation, still has passive suicidal thoughts. Insight and judgment improving. Normal intelligence. Alert and oriented x 4.   PHYSICAL EXAMINATION: GENERAL: The patient does not appear to be in acute physical distress.  SKIN: On her left wrist, she has evidence of 2 serious longitudinal cuts that were suicide attempts. No other skin lesions identified.  FACE:  Symmetric.  EYES: Equal and symmetric with  pupils reactive.  MOUTH: Oral mucosa normal.  MUSCULOSKELETAL: Strength and reflexes normal and symmetric throughout.  NEUROLOGIC: Gait normal. Strength and reflexes normal and symmetric throughout. Cranial nerves symmetric and normal. LUNGS: Clear, no wheezes.   HEART: Regular rate and rhythm.  ABDOMEN: Soft, nontender. Normal bowel sounds.  VITAL SIGNS: Flow sheet shows temperature of 98.1, pulse 81, respirations 18, blood pressure 113/78.   LABORATORY RESULTS: Urinalysis positive for leukocyte esterase, many white blood cells, some red blood cells. Looks like she is having her menstrual period but also has an infection. Chemistry panel shows a drug screen positive for cocaine. TSH low at 0.3. Alcohol undetected. Chemistry panel elevated. ALT and AST at 103 and 41, respectively. CBC unremarkable. Pregnancy test negative.   ASSESSMENT: A 29 year old woman with alcohol dependence and cocaine dependence and severe depression with suicidal ideation and behavior, not getting any outpatient treatment, needs hospital treatment for detox and stabilization.   TREATMENT PLAN: Detox protocol in place. Monitor vital signs. Monitor for signs of delirium or withdrawal. Education and supportive therapy. Discussed treatment options. We are trying to encourage her to go to the alcohol and drug abuse treatment center for follow-up. I have elected to start her on Prozac 20 mg a day for depression, trazodone at night for sleep and Septra for the urinary tract infection.   DIAGNOSIS, PRINCIPAL AND PRIMARY:  AXIS I: Major depression, severe, recurrent, with psychotic features.   SECONDARY DIAGNOSES: AXIS I:  1.  Alcohol dependence.  2.  Cocaine dependence.  AXIS II: Deferred.  AXIS III: Urinary tract infection, status post cuts to her wrists that are healing.  AXIS IV: Severe from financial difficulties and substance abuse.  AXIS V: Functioning at time of evaluation is 30.  ____________________________ Audery AmelJohn T. Gust Eugene, MD jtc:sb D: 04/18/2013 13:57:32 ET T: 04/18/2013 14:28:56 ET JOB#: [id number]  cc: Audery AmelJohn T. Keyari Kleeman, MD, <Dictator> Audery AmelJOHN T Reginia Battie MD ELECTRONICALLY SIGNED 04/18/2013 17:15
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
## 2017-03-09 ENCOUNTER — Emergency Department
Admission: EM | Admit: 2017-03-09 | Discharge: 2017-03-09 | Disposition: A | Discharge status: Home / Self Care | Payer: Medicaid Other | Attending: Emergency Medicine | Admitting: Emergency Medicine

## 2017-03-09 ENCOUNTER — Encounter: Payer: Self-pay | Admitting: Emergency Medicine

## 2017-03-09 ENCOUNTER — Emergency Department: Payer: Medicaid Other

## 2017-03-09 DIAGNOSIS — S0083XA Contusion of other part of head, initial encounter: Secondary | ICD-10-CM | POA: Insufficient documentation

## 2017-03-09 DIAGNOSIS — T148XXA Other injury of unspecified body region, initial encounter: Secondary | ICD-10-CM

## 2017-03-09 DIAGNOSIS — Y999 Unspecified external cause status: Secondary | ICD-10-CM | POA: Diagnosis not present

## 2017-03-09 DIAGNOSIS — Y939 Activity, unspecified: Secondary | ICD-10-CM | POA: Diagnosis not present

## 2017-03-09 DIAGNOSIS — R600 Localized edema: Secondary | ICD-10-CM

## 2017-03-09 DIAGNOSIS — S0990XA Unspecified injury of head, initial encounter: Secondary | ICD-10-CM | POA: Diagnosis present

## 2017-03-09 DIAGNOSIS — Y92009 Unspecified place in unspecified non-institutional (private) residence as the place of occurrence of the external cause: Secondary | ICD-10-CM | POA: Diagnosis not present

## 2017-03-09 DIAGNOSIS — F17200 Nicotine dependence, unspecified, uncomplicated: Secondary | ICD-10-CM | POA: Insufficient documentation

## 2017-03-09 MED ORDER — NAPROXEN 500 MG PO TABS: 500.0000 mg | ORAL_TABLET | Freq: Two times a day (BID) | ORAL | 0 refills | Status: AC

## 2017-03-09 MED ORDER — HYDROCODONE-ACETAMINOPHEN 5-325 MG PO TABS: 1.0000 | ORAL_TABLET | Freq: Four times a day (QID) | ORAL | 0 refills | Status: DC | PRN

## 2017-03-09 MED ORDER — CYCLOBENZAPRINE HCL 5 MG PO TABS: 5.0000 mg | ORAL_TABLET | Freq: Three times a day (TID) | ORAL | 0 refills | Status: DC | PRN

## 2017-03-09 MED ORDER — ONDANSETRON 4 MG PO TBDP
4.0000 mg | ORAL_TABLET | Freq: Once | ORAL | Status: AC
  Administered 2017-03-09: 4 mg via ORAL
  Filled 2017-03-09: qty 1

## 2017-03-09 MED ORDER — HYDROCODONE-ACETAMINOPHEN 5-325 MG PO TABS
1.0000 | ORAL_TABLET | Freq: Once | ORAL | Status: AC
  Administered 2017-03-09: 1 via ORAL
  Filled 2017-03-09: qty 1
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
## 2017-11-13 ENCOUNTER — Emergency Department
Admission: EM | Admit: 2017-11-13 | Discharge: 2017-11-13 | Disposition: A | Discharge status: Home / Self Care | Payer: Medicaid Other | Attending: Student in an Organized Health Care Education/Training Program | Admitting: Student in an Organized Health Care Education/Training Program

## 2017-11-13 ENCOUNTER — Encounter: Payer: Self-pay | Admitting: Emergency Medicine

## 2017-11-13 DIAGNOSIS — F1410 Cocaine abuse, uncomplicated: Secondary | ICD-10-CM | POA: Diagnosis not present

## 2017-11-13 DIAGNOSIS — F1910 Other psychoactive substance abuse, uncomplicated: Secondary | ICD-10-CM

## 2017-11-13 DIAGNOSIS — F329 Major depressive disorder, single episode, unspecified: Secondary | ICD-10-CM | POA: Diagnosis not present

## 2017-11-13 DIAGNOSIS — F17210 Nicotine dependence, cigarettes, uncomplicated: Secondary | ICD-10-CM | POA: Insufficient documentation

## 2017-11-13 DIAGNOSIS — F1020 Alcohol dependence, uncomplicated: Secondary | ICD-10-CM | POA: Diagnosis not present

## 2017-11-13 HISTORY — DX: Other psychoactive substance dependence, uncomplicated: F19.20

## 2017-11-13 LAB — CBC
HCT: 48 % — ABNORMAL HIGH (ref 35.0–47.0)
HEMOGLOBIN: 16.5 g/dL — AB (ref 12.0–16.0)
MCH: 31.6 pg (ref 26.0–34.0)
MCHC: 34.5 g/dL (ref 32.0–36.0)
MCV: 91.6 fL (ref 80.0–100.0)
Platelets: 337 10*3/uL (ref 150–440)
RBC: 5.24 MIL/uL — ABNORMAL HIGH (ref 3.80–5.20)
RDW: 13.5 % (ref 11.5–14.5)
WBC: 11.1 10*3/uL — AB (ref 3.6–11.0)

## 2017-11-13 LAB — COMPREHENSIVE METABOLIC PANEL
ALBUMIN: 3.9 g/dL (ref 3.5–5.0)
ALK PHOS: 52 U/L (ref 38–126)
ALT: 13 U/L — ABNORMAL LOW (ref 14–54)
ANION GAP: 10 (ref 5–15)
AST: 18 U/L (ref 15–41)
BUN: 7 mg/dL (ref 6–20)
CALCIUM: 8.4 mg/dL — AB (ref 8.9–10.3)
CO2: 18 mmol/L — ABNORMAL LOW (ref 22–32)
Chloride: 109 mmol/L (ref 101–111)
Creatinine, Ser: 0.59 mg/dL (ref 0.44–1.00)
GFR calc Af Amer: >60 mL/min (ref >60)
GLUCOSE: 85 mg/dL (ref 65–99)
POTASSIUM: 3.5 mmol/L (ref 3.5–5.1)
Sodium: 137 mmol/L (ref 135–145)
Total Bilirubin: 0.4 mg/dL (ref 0.3–1.2)
Total Protein: 7.4 g/dL (ref 6.5–8.1)

## 2017-11-13 LAB — POCT PREGNANCY, URINE: Preg Test, Ur: NEGATIVE

## 2017-11-13 LAB — URINE DRUG SCREEN, QUALITATIVE (ARMC ONLY)
AMPHETAMINES, UR SCREEN: NOT DETECTED
BARBITURATES, UR SCREEN: NOT DETECTED
BENZODIAZEPINE, UR SCRN: NOT DETECTED
COCAINE METABOLITE, UR ~~LOC~~: POSITIVE — AB
Cannabinoid 50 Ng, Ur ~~LOC~~: NOT DETECTED
MDMA (Ecstasy)Ur Screen: NOT DETECTED
METHADONE SCREEN, URINE: NOT DETECTED
Opiate, Ur Screen: NOT DETECTED
Phencyclidine (PCP) Ur S: NOT DETECTED
TRICYCLIC, UR SCREEN: NOT DETECTED

## 2017-11-13 LAB — ETHANOL: ALCOHOL ETHYL (B): 229 mg/dL — AB (ref <10)

## 2017-11-13 NOTE — ED Provider Notes (Signed)
[facility] Emergency Department Provider Note    First MD Initiated Contact with Patient 11/13/17 [phone]     (approximate)  I have reviewed the triage vital signs and the nursing notes.   HISTORY  Chief Complaint Addiction Problem    HPI Angel Ortiz is a 32 y.o. female history of polysubstance abuse chemical dependency on alcohol as well as crack cocaine presents the ER seeking help with detox from alcohol and crack cocaine.  States the last time she used was last night did admit to drink taking alcohol.  Denies any SI or HI.  States that she is "out of breaking all point "and feels that she needs to get clean for her 3 children.  Denies any other symptoms at this time but does admit to being intoxicated.    Past Medical History:  Diagnosis Date  . Addiction (HCC)    No family history on file. Past Surgical History:  Procedure Laterality Date  . cesarean section     x3  . TONSILLECTOMY     There are no active problems to display for this patient.     Prior to Admission medications   Medication Sig Start Date End Date Taking? Authorizing Provider  cyclobenzaprine (FLEXERIL) 5 MG tablet Take 1 tablet (5 mg total) by mouth 3 (three) times daily as needed for muscle spasms. 03/09/17   Menshew, Charlesetta IvoryJenise V Bacon, PA-C  HYDROcodone-acetaminophen (NORCO) 5-325 MG tablet Take 1 tablet by mouth every 6 (six) hours as needed. 03/09/17   Menshew, Charlesetta IvoryJenise V Bacon, PA-C    Allergies Patient has no known allergies.    Social History Social History   Tobacco Use  . Smoking status: Current Every Day Smoker  . Smokeless tobacco: Never Used  Substance Use Topics  . Alcohol use: Yes    Comment: daily  . Drug use: Yes    Types: Cocaine    Review of Systems Patient denies headaches, rhinorrhea, blurry vision, numbness, shortness of breath, chest pain, edema, cough, abdominal pain, nausea, vomiting, diarrhea, dysuria, fevers, rashes or hallucinations unless  otherwise stated above in HPI. ____________________________________________   PHYSICAL EXAM:  VITAL SIGNS: Vitals:   11/13/17 0917  BP: (!) 130/97  Pulse: (!) 103  Resp: 18  Temp: 97.8 F (36.6 C)  SpO2: 98%    Constitutional: Alert and oriented.  Eyes: Conjunctivae are normal.  Head: Atraumatic. Nose: No congestion/rhinnorhea. Mouth/Throat: Mucous membranes are moist.   Neck: No stridor. Painless ROM.  Cardiovascular: Normal rate, regular rhythm. Grossly normal heart sounds.  Good peripheral circulation. Respiratory: Normal respiratory effort.  No retractions. Lungs CTAB. Gastrointestinal: Soft and nontender. No distention. No abdominal bruits. No CVA tenderness. Genitourinary: deferred Musculoskeletal: No lower extremity tenderness nor edema.  No joint effusions. Neurologic:  Normal speech and language. No gross focal neurologic deficits are appreciated. No facial droop Skin:  Skin is warm, dry and intact. No rash noted. Psychiatric:tearful, no si or hi  ____________________________________________   LABS (all labs ordered are listed, but only abnormal results are displayed)  Results for orders placed or performed during the hospital encounter of 11/13/17 (from the past 24 hour(s))  Comprehensive metabolic panel     Status: Abnormal   Collection Time: 11/13/17  9:20 AM  Result Value Ref Range   Sodium 137 135 - 145 mmol/L   Potassium 3.5 3.5 - 5.1 mmol/L   Chloride 109 101 - 111 mmol/L   CO2 18 (L) 22 - 32 mmol/L   Glucose,  Bld 85 65 - 99 mg/dL   BUN 7 6 - 20 mg/dL   Creatinine, Ser 1.61 0.44 - 1.00 mg/dL   Calcium 8.4 (L) 8.9 - 10.3 mg/dL   Total Protein 7.4 6.5 - 8.1 g/dL   Albumin 3.9 3.5 - 5.0 g/dL   AST 18 15 - 41 U/L   ALT 13 (L) 14 - 54 U/L   Alkaline Phosphatase 52 38 - 126 U/L   Total Bilirubin 0.4 0.3 - 1.2 mg/dL   GFR calc non Af Amer >60 >60 mL/min   GFR calc Af Amer >60 >60 mL/min   Anion gap 10 5 - 15  cbc     Status: Abnormal   Collection  Time: 11/13/17  9:20 AM  Result Value Ref Range   WBC 11.1 (H) 3.6 - 11.0 K/uL   RBC 5.24 (H) 3.80 - 5.20 MIL/uL   Hemoglobin 16.5 (H) 12.0 - 16.0 g/dL   HCT 09.6 (H) 04.5 - 40.9 %   MCV 91.6 80.0 - 100.0 fL   MCH 31.6 26.0 - 34.0 pg   MCHC 34.5 32.0 - 36.0 g/dL   RDW 81.1 91.4 - 78.2 %   Platelets 337 150 - 440 K/uL  Pregnancy, urine POC     Status: None   Collection Time: 11/13/17  9:37 AM  Result Value Ref Range   Preg Test, Ur NEGATIVE NEGATIVE   ____________________________________________ ____________________________________________  RADIOLOGY   ____________________________________________   PROCEDURES  Procedure(s) performed:  Procedures    Critical Care performed: no ____________________________________________   INITIAL IMPRESSION / ASSESSMENT AND PLAN / ED COURSE  Pertinent labs & imaging results that were available during my care of the patient were reviewed by me and considered in my medical decision making (see chart for details).   DDX: Psychosis, delirium, medication effect, noncompliance, polysubstance abuse, Si, Hi, depression   Angel Ortiz is a 32 y.o. who presents to the ED with chemical dependency seeking assistance with detox.  Patient not showing evidence of active withdrawal and is admitting to using cocaine and alcohol last night.  Will see if there are any availability for inpatient rehab at this time.  Clinical Course as of Nov 13 1298  Tue Nov 13, 2017  1259 The patient has been accepted to [facility] for detox.  Have discussed with the patient and available family all diagnostics and treatments performed thus far and all questions were answered to the best of my ability. The patient demonstrates understanding and agreement with plan.    [PR]    Clinical Course User Index [PR] Willy Eddy, MD     As part of my medical decision making, I reviewed the following data within the electronic MEDICAL RECORD NUMBER Nursing notes reviewed  and incorporated, Labs reviewed, notes from prior ED visits and [place] Controlled Substance Database   ____________________________________________   FINAL CLINICAL IMPRESSION(S) / ED DIAGNOSES  Final diagnoses:  Polysubstance abuse (HCC)      NEW MEDICATIONS STARTED DURING THIS VISIT:  New Prescriptions   No medications on file     Note:  This document was prepared using Dragon voice recognition software and may include unintentional dictation errors.    Willy Eddy, MD 11/13/17 1300

## 2017-11-13 NOTE — ED Notes (Signed)
Pt with a repeat at CIWA scale at 1100  Pt scored a 4 at that time
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
## 2017-11-13 NOTE — ED Triage Notes (Signed)
Patient presents to the ED wanting assistance for addiction to cocaine.  Patient states, "I use crack and I have three children and I have to stop."  Patient denies SI.  Patient states, "I have been here before, downstairs and it hasn't worked, I haven't done it right.  I need to go somewhere where they can specifically help me with this. I can't do it by myself."  Patient reports being addicted at this time.  Patient states she last used drugs last night. Patient reports drinking alcohol and states she drinks alcohol daily.

## 2017-11-13 NOTE — BH Assessment (Signed)
Patient has been accepted to [facility]  Call report to [phone].  Representative was Ranelle (nurse)  ER Staff is aware of it:  Lisa: ER Secretary  Dr. Roxan Hockeyobinson: ER MD  Amy: Patient's Nurse     Patient's Family/Support System April Holding(Mary Sura (mother) [phone] left HIPPA compliant message) have been updated as well.  Address: [address]                 [place], [place] [id number]  *Can arrive anytime*

## 2017-11-13 NOTE — ED Notes (Signed)
Patient appears to be asleep, in no acute distress

## 2017-11-13 NOTE — BH Assessment (Signed)
This Clinical research associatewriter received call from BassettJen at [facility] and she stated patient would have to self-pay because they are not in network for [employer].

## 2017-11-13 NOTE — BH Assessment (Addendum)
Referrals sent to the following:  . [facility] [phone])  . [facility]:([phone]) Per Will, doesn't have a female bed available, but will in a few days.  . [facility]:([phone]) Per Candise BowensJen, female bed is available, and patient would have to come there for an assessment and whoever brings her would have to stay until she sees the nurse and is cleared to be admitted.          Kari Baars. [facility]  [phone]
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
## 2024-05-23 ENCOUNTER — Emergency Department (HOSPITAL_COMMUNITY)
Admission: EM | Admit: 2024-05-23 | Discharge: 2024-05-24 | Disposition: A | Payer: MEDICAID | Attending: Emergency Medicine | Admitting: Emergency Medicine

## 2024-05-23 ENCOUNTER — Emergency Department (HOSPITAL_COMMUNITY): Payer: MEDICAID

## 2024-05-23 ENCOUNTER — Other Ambulatory Visit: Payer: Self-pay

## 2024-05-23 DIAGNOSIS — F1420 Cocaine dependence, uncomplicated: Secondary | ICD-10-CM | POA: Insufficient documentation

## 2024-05-23 DIAGNOSIS — F1010 Alcohol abuse, uncomplicated: Secondary | ICD-10-CM | POA: Insufficient documentation

## 2024-05-23 DIAGNOSIS — T50904A Poisoning by unspecified drugs, medicaments and biological substances, undetermined, initial encounter: Secondary | ICD-10-CM

## 2024-05-23 DIAGNOSIS — R4182 Altered mental status, unspecified: Secondary | ICD-10-CM | POA: Insufficient documentation

## 2024-05-23 DIAGNOSIS — F333 Major depressive disorder, recurrent, severe with psychotic symptoms: Secondary | ICD-10-CM | POA: Insufficient documentation

## 2024-05-23 DIAGNOSIS — I959 Hypotension, unspecified: Secondary | ICD-10-CM | POA: Insufficient documentation

## 2024-05-23 LAB — COMPREHENSIVE METABOLIC PANEL WITH GFR
ALT: 10 U/L (ref 0–44)
AST: 17 U/L (ref 15–41)
Albumin: 4 g/dL (ref 3.5–5.0)
Alkaline Phosphatase: 52 U/L (ref 38–126)
Anion gap: 13 (ref 5–15)
BUN: 5 mg/dL — ABNORMAL LOW (ref 6–20)
CO2: 20 mmol/L — ABNORMAL LOW (ref 22–32)
Calcium: 8.6 mg/dL — ABNORMAL LOW (ref 8.9–10.3)
Chloride: 106 mmol/L (ref 98–111)
Creatinine, Ser: 0.96 mg/dL (ref 0.44–1.00)
GFR, Estimated: >60 mL/min (ref >60)
Glucose, Bld: 83 mg/dL (ref 70–99)
Potassium: 3.6 mmol/L (ref 3.5–5.1)
Sodium: 139 mmol/L (ref 135–145)
Total Bilirubin: 0.3 mg/dL (ref 0.0–1.2)
Total Protein: 7 g/dL (ref 6.5–8.1)

## 2024-05-23 LAB — CBC WITH DIFFERENTIAL/PLATELET
Abs Immature Granulocytes: 0.05 K/uL (ref 0.00–0.07)
Basophils Absolute: 0 K/uL (ref 0.0–0.1)
Basophils Relative: 0 %
Eosinophils Absolute: 0 K/uL (ref 0.0–0.5)
Eosinophils Relative: 0 %
HCT: 39.7 % (ref 36.0–46.0)
Hemoglobin: 13.3 g/dL (ref 12.0–15.0)
Immature Granulocytes: 0 %
Lymphocytes Relative: 12 %
Lymphs Abs: 1.4 K/uL (ref 0.7–4.0)
MCH: 29.2 pg (ref 26.0–34.0)
MCHC: 33.5 g/dL (ref 30.0–36.0)
MCV: 87.1 fL (ref 80.0–100.0)
Monocytes Absolute: 0.7 K/uL (ref 0.1–1.0)
Monocytes Relative: 6 %
Neutro Abs: 9.8 K/uL — ABNORMAL HIGH (ref 1.7–7.7)
Neutrophils Relative %: 82 %
Platelets: 326 K/uL (ref 150–400)
RBC: 4.56 MIL/uL (ref 3.87–5.11)
RDW: 15.7 % — ABNORMAL HIGH (ref 11.5–15.5)
WBC: 12 K/uL — ABNORMAL HIGH (ref 4.0–10.5)
nRBC: 0 % (ref 0.0–0.2)

## 2024-05-23 LAB — ETHANOL: Alcohol, Ethyl (B): <15 mg/dL (ref <15)

## 2024-05-23 MED ORDER — LACTATED RINGERS IV BOLUS
1000.0000 mL | Freq: Once | INTRAVENOUS | Status: AC
  Administered 2024-05-23: 1000 mL via INTRAVENOUS

## 2024-05-23 NOTE — ED Provider Notes (Signed)
 Care assumed from Dr. Patsey, patient with altered mental status with somnolence, possible overdose which did not respond to naloxone. Labs are pending, will need observation. Will need psychiatric evaluation after waking up.  3:21 AM I have reviewed her laboratory tests, my interpretation is not pregnant, drug screen positive for cocaine, probable UTI with positive nitrite and 21-50 WBCs per high-power field, normal comprehensive metabolic panel, undetectable ethanol level, mild leukocytosis which is nonspecific.  Chest x-ray shows no acute cardiopulmonary process, CT of head shows no acute intracranial abnormality.  I have independently viewed all the images, and agree with the radiologist's interpretation.  I have reevaluated the patient, she is now arousable and admits that she took a bunch of meds and admits that there was suicidal intent.  She is medically cleared for psychiatric evaluation and treatment.  Results for orders placed or performed during the hospital encounter of 05/23/24  Comprehensive metabolic panel   Collection Time: 05/23/24 11:19 PM  Result Value Ref Range   Sodium 139 135 - 145 mmol/L   Potassium 3.6 3.5 - 5.1 mmol/L   Chloride 106 98 - 111 mmol/L   CO2 20 (L) 22 - 32 mmol/L   Glucose, Bld 83 70 - 99 mg/dL   BUN 5 (L) 6 - 20 mg/dL   Creatinine, Ser 9.03 0.44 - 1.00 mg/dL   Calcium 8.6 (L) 8.9 - 10.3 mg/dL   Total Protein 7.0 6.5 - 8.1 g/dL   Albumin 4.0 3.5 - 5.0 g/dL   AST 17 15 - 41 U/L   ALT 10 0 - 44 U/L   Alkaline Phosphatase 52 38 - 126 U/L   Total Bilirubin 0.3 0.0 - 1.2 mg/dL   GFR, Estimated >39 >39 mL/min   Anion gap 13 5 - 15  Ethanol   Collection Time: 05/23/24 11:19 PM  Result Value Ref Range   Alcohol, Ethyl (B) <15 <15 mg/dL  CBC with Differential   Collection Time: 05/23/24 11:19 PM  Result Value Ref Range   WBC 12.0 (H) 4.0 - 10.5 K/uL   RBC 4.56 3.87 - 5.11 MIL/uL   Hemoglobin 13.3 12.0 - 15.0 g/dL   HCT 60.2 63.9 - 53.9 %   MCV  87.1 80.0 - 100.0 fL   MCH 29.2 26.0 - 34.0 pg   MCHC 33.5 30.0 - 36.0 g/dL   RDW 84.2 (H) 88.4 - 84.4 %   Platelets 326 150 - 400 K/uL   nRBC 0.0 0.0 - 0.2 %   Neutrophils Relative % 82 %   Neutro Abs 9.8 (H) 1.7 - 7.7 K/uL   Lymphocytes Relative 12 %   Lymphs Abs 1.4 0.7 - 4.0 K/uL   Monocytes Relative 6 %   Monocytes Absolute 0.7 0.1 - 1.0 K/uL   Eosinophils Relative 0 %   Eosinophils Absolute 0.0 0.0 - 0.5 K/uL   Basophils Relative 0 %   Basophils Absolute 0.0 0.0 - 0.1 K/uL   Immature Granulocytes 0 %   Abs Immature Granulocytes 0.05 0.00 - 0.07 K/uL  Urine rapid drug screen (hosp performed)   Collection Time: 05/24/24  2:39 AM  Result Value Ref Range   Opiates NEGATIVE NEGATIVE   Cocaine POSITIVE (A) NEGATIVE   Benzodiazepines NEGATIVE NEGATIVE   Amphetamines NEGATIVE NEGATIVE   Tetrahydrocannabinol NEGATIVE NEGATIVE   Barbiturates NEGATIVE NEGATIVE   Methadone Scn, Ur NEGATIVE NEGATIVE   Fentanyl  NEGATIVE NEGATIVE  Pregnancy, urine   Collection Time: 05/24/24  2:39 AM  Result Value Ref Range  Preg Test, Ur NEGATIVE NEGATIVE  Urinalysis, Routine w reflex microscopic -Urine, Catheterized   Collection Time: 05/24/24  2:39 AM  Result Value Ref Range   Color, Urine YELLOW YELLOW   APPearance HAZY (A) CLEAR   Specific Gravity, Urine 1.006 1.005 - 1.030   pH 6.0 5.0 - 8.0   Glucose, UA NEGATIVE NEGATIVE mg/dL   Hgb urine dipstick LARGE (A) NEGATIVE   Bilirubin Urine NEGATIVE NEGATIVE   Ketones, ur NEGATIVE NEGATIVE mg/dL   Protein, ur 30 (A) NEGATIVE mg/dL   Nitrite POSITIVE (A) NEGATIVE   Leukocytes,Ua MODERATE (A) NEGATIVE   RBC / HPF 0-5 0 - 5 RBC/hpf   WBC, UA 21-50 0 - 5 WBC/hpf   Bacteria, UA FEW (A) NONE SEEN   Squamous Epithelial / HPF 0-5 0 - 5 /HPF   DG Chest Portable 1 View Result Date: 05/23/2024 EXAM: 1 VIEW(S) XRAY OF THE CHEST 05/23/2024 11:06:04 PM COMPARISON: 9 / 28 / 18 CLINICAL HISTORY: ams ams FINDINGS: LUNGS AND PLEURA: No focal pulmonary  opacity. No pleural effusion. No pneumothorax. HEART AND MEDIASTINUM: No acute abnormality of the cardiac and mediastinal silhouettes. BONES AND SOFT TISSUES: No acute osseous abnormality. IMPRESSION: 1. No acute process. Electronically signed by: Norman Gatlin MD 05/23/2024 11:10 PM EST RP Workstation: [id number]   CT Head Wo Contrast Result Date: 05/23/2024 EXAM: CT HEAD WITHOUT 05/23/2024 11:04:42 PM TECHNIQUE: CT of the head was performed without the administration of intravenous contrast. Automated exposure control, iterative reconstruction, and/or weight based adjustment of the mA/kV was utilized to reduce the radiation dose to as low as reasonably achievable. COMPARISON: 03/09/2017 CLINICAL HISTORY: Mental status change, unknown cause. FINDINGS: BRAIN AND VENTRICLES: No acute intracranial hemorrhage. No mass effect or midline shift. No extra-axial fluid collection. No evidence of acute infarct. No hydrocephalus. ORBITS: No acute abnormality. SINUSES AND MASTOIDS: No acute abnormality. SOFT TISSUES AND SKULL: No acute skull fracture. No acute soft tissue abnormality. IMPRESSION: 1. No acute intracranial abnormality. Electronically signed by: Franky Stanford MD 05/23/2024 11:06 PM EST RP Workstation: [id number]      Raford Lenis, MD 05/24/24 [phone]

## 2024-05-23 NOTE — ED Triage Notes (Signed)
 Patient BIB EMS for evaluation of possible overdose.  Patient has hx of cocaine use.  Crack pipe found in bra at home.  Per report, patient was picked up from home after being found unresponsive by boyfriend.  Boyfriend reports he left the house and when he returned he found patient unresponsive.  No reports of fever.  Responsive to pain on arrival.    20 gauge L AC BP 101/56 97% Room Air HR 92 End tidal 35

## 2024-05-23 NOTE — ED Notes (Signed)
 Patient taken to CT at this time.

## 2024-05-23 NOTE — ED Notes (Signed)
 Drug like substance found in patient's bra.  Disposed of in sharp container by security and RN.

## 2024-05-23 NOTE — ED Provider Notes (Signed)
 [place] EMERGENCY DEPARTMENT AT [facility] Provider Note   CSN: [id number] Arrival date & time: 05/23/24  2203     Patient presents with: Drug Overdose   Angel Ortiz is a 38 y.o. female.    Drug Overdose  Patient by EMS for possible overdose.  Reportedly was at home and had been somewhat slow throughout the day.  Then boyfriend came back and found her unresponsive.  No fever.  Given 4 mg of nasal Narcan by Fire with possibly some improvement.  Then had 2 mg IV by EMS without improvement.  Does have some hypotension.  Found with crack pipe in bra and small bag with crystal in it.  Reported history of cocaine use.       Prior to Admission medications  Medication Sig Start Date End Date Taking? Authorizing Provider  cyclobenzaprine  (FLEXERIL ) 5 MG tablet Take 1 tablet (5 mg total) by mouth 3 (three) times daily as needed for muscle spasms. Patient not taking: Reported on 11/13/2017 03/09/17   Menshew, Candida LULLA Kings, PA-C  HYDROcodone -acetaminophen  (NORCO) 5-325 MG tablet Take 1 tablet by mouth every 6 (six) hours as needed. Patient not taking: Reported on 11/13/2017 03/09/17   Menshew, Candida LULLA Kings, PA-C    Allergies: Patient has no known allergies.    Review of Systems  Updated Vital Signs BP (!) 89/65   Pulse 93   Temp 98.6 F (37 C) (Oral)   Resp 13   SpO2 100%   Physical Exam Vitals and nursing note reviewed.  Eyes:     Comments: Pupils constricted.  Cardiovascular:     Rate and Rhythm: Normal rate.  Pulmonary:     Breath sounds: No wheezing.  Abdominal:     Tenderness: There is no abdominal tenderness.  Neurological:     Comments: Mild withdraw to pain.  Still has a weak gag reflex.  Will not follow commands.     (all labs ordered are listed, but only abnormal results are displayed) Labs Reviewed  COMPREHENSIVE METABOLIC PANEL WITH GFR  ETHANOL  URINE DRUG SCREEN  PREGNANCY, URINE  CBC WITH DIFFERENTIAL/PLATELET  URINALYSIS, ROUTINE  W REFLEX MICROSCOPIC    EKG: None  Radiology: No results found.   Procedures   Medications Ordered in the ED  lactated ringers bolus 1,000 mL (1,000 mLs Intravenous New Bag/Given 05/23/24 2232)                                    Medical Decision Making Amount and/or Complexity of Data Reviewed Labs: ordered. Radiology: ordered.   Patient with mental status change.  Reportedly rather acute onset.  Decreased mental status.  Pupils constricted.  Reportedly only had small response to Narcan.  Patient's boyfriend is later here.  Reportedly has been somewhat depressed.  Thinks she may have taken some pills.  Potentially the Abilify that she is on.  Unsure also what else she may have taken.  Differential diagnosis does include causes such as overdose both to abuse of substances but also medications.  Has hypotension and had had some mild tachycardia.  Pupils constricted.  At this point needs close monitoring.  Still has gag reflex but is mildly suppressed.  Will get basic blood work.  Will get head CT and chest x-ray.  Give fluid bolus for the hypotension.  Care will be turned over to Dr. Raford.   CRITICAL CARE Performed by: [place]  Total critical care time: 30 minutes Critical care time was exclusive of separately billable procedures and treating other patients. Critical care was necessary to treat or prevent imminent or life-threatening deterioration. Critical care was time spent personally by me on the following activities: development of treatment plan with patient and/or surrogate as well as nursing, discussions with consultants, evaluation of patient's response to treatment, examination of patient, obtaining history from patient or surrogate, ordering and performing treatments and interventions, ordering and review of laboratory studies, ordering and review of radiographic studies, pulse oximetry and re-evaluation of patient's condition.    Drug database  reviewed and showed gabapentin.  Last filled about 2 months ago.    Final diagnoses:  None    ED Discharge Orders     None          Patsey Lot, MD 05/23/24 2245

## 2024-05-24 ENCOUNTER — Encounter (HOSPITAL_COMMUNITY): Payer: Self-pay

## 2024-05-24 ENCOUNTER — Other Ambulatory Visit: Payer: Self-pay

## 2024-05-24 ENCOUNTER — Inpatient Hospital Stay (HOSPITAL_COMMUNITY)
Admission: AD | Admit: 2024-05-24 | Discharge: 2024-05-31 | DRG: 897 | Disposition: A | Discharge status: Home / Self Care | Payer: MEDICAID | Attending: Student in an Organized Health Care Education/Training Program | Admitting: Student in an Organized Health Care Education/Training Program

## 2024-05-24 DIAGNOSIS — R400 Somnolence: Secondary | ICD-10-CM | POA: Diagnosis present

## 2024-05-24 DIAGNOSIS — F17200 Nicotine dependence, unspecified, uncomplicated: Secondary | ICD-10-CM | POA: Diagnosis present

## 2024-05-24 DIAGNOSIS — F333 Major depressive disorder, recurrent, severe with psychotic symptoms: Secondary | ICD-10-CM | POA: Diagnosis not present

## 2024-05-24 DIAGNOSIS — F322 Major depressive disorder, single episode, severe without psychotic features: Secondary | ICD-10-CM | POA: Diagnosis present

## 2024-05-24 DIAGNOSIS — F332 Major depressive disorder, recurrent severe without psychotic features: Secondary | ICD-10-CM | POA: Diagnosis present

## 2024-05-24 DIAGNOSIS — Z9152 Personal history of nonsuicidal self-harm: Secondary | ICD-10-CM

## 2024-05-24 DIAGNOSIS — F39 Unspecified mood [affective] disorder: Secondary | ICD-10-CM

## 2024-05-24 DIAGNOSIS — F1424 Cocaine dependence with cocaine-induced mood disorder: Secondary | ICD-10-CM | POA: Diagnosis present

## 2024-05-24 DIAGNOSIS — Z79899 Other long term (current) drug therapy: Secondary | ICD-10-CM

## 2024-05-24 DIAGNOSIS — F1420 Cocaine dependence, uncomplicated: Principal | ICD-10-CM

## 2024-05-24 DIAGNOSIS — Z560 Unemployment, unspecified: Secondary | ICD-10-CM | POA: Diagnosis not present

## 2024-05-24 DIAGNOSIS — F329 Major depressive disorder, single episode, unspecified: Secondary | ICD-10-CM | POA: Diagnosis not present

## 2024-05-24 DIAGNOSIS — F1090 Alcohol use, unspecified, uncomplicated: Secondary | ICD-10-CM | POA: Diagnosis present

## 2024-05-24 DIAGNOSIS — Z638 Other specified problems related to primary support group: Secondary | ICD-10-CM

## 2024-05-24 DIAGNOSIS — F419 Anxiety disorder, unspecified: Secondary | ICD-10-CM | POA: Diagnosis present

## 2024-05-24 LAB — URINALYSIS, ROUTINE W REFLEX MICROSCOPIC
Bilirubin Urine: NEGATIVE
Glucose, UA: NEGATIVE mg/dL
Ketones, ur: NEGATIVE mg/dL
Nitrite: POSITIVE — AB
Protein, ur: 30 mg/dL — AB
Specific Gravity, Urine: 1.006 (ref 1.005–1.030)
pH: 6 (ref 5.0–8.0)

## 2024-05-24 LAB — URINE DRUG SCREEN
Amphetamines: NEGATIVE
Barbiturates: NEGATIVE
Benzodiazepines: NEGATIVE
Cocaine: POSITIVE — AB
Fentanyl: NEGATIVE
Methadone Scn, Ur: NEGATIVE
Opiates: NEGATIVE
Tetrahydrocannabinol: NEGATIVE

## 2024-05-24 LAB — PREGNANCY, URINE: Preg Test, Ur: NEGATIVE

## 2024-05-24 MED ORDER — MAGNESIUM HYDROXIDE 400 MG/5ML PO SUSP: 30.0000 mL | Freq: Every day | ORAL | Status: DC | PRN

## 2024-05-24 MED ORDER — ACETAMINOPHEN 325 MG PO TABS: 650.0000 mg | ORAL_TABLET | ORAL | Status: DC | PRN

## 2024-05-24 MED ORDER — DIPHENHYDRAMINE HCL 50 MG/ML IJ SOLN: 50.0000 mg | Freq: Three times a day (TID) | INTRAMUSCULAR | Status: DC | PRN

## 2024-05-24 MED ORDER — MAGNESIUM HYDROXIDE 400 MG/5ML PO SUSP
30.0000 mL | Freq: Every day | ORAL | Status: DC | PRN
  Administered 2024-05-25: 30 mL via ORAL
  Filled 2024-05-24: qty 30

## 2024-05-24 MED ORDER — HYDROXYZINE HCL 25 MG PO TABS
25.0000 mg | ORAL_TABLET | Freq: Three times a day (TID) | ORAL | Status: DC | PRN
  Administered 2024-05-24 – 2024-05-30 (×7): 25 mg via ORAL
  Filled 2024-05-24 (×7): qty 1

## 2024-05-24 MED ORDER — HALOPERIDOL LACTATE 5 MG/ML IJ SOLN: 10.0000 mg | Freq: Three times a day (TID) | INTRAMUSCULAR | Status: DC | PRN

## 2024-05-24 MED ORDER — QUETIAPINE FUMARATE 50 MG PO TABS
50.0000 mg | ORAL_TABLET | Freq: Every day | ORAL | Status: DC
  Administered 2024-05-24 – 2024-05-30 (×7): 50 mg via ORAL
  Filled 2024-05-24 (×7): qty 1

## 2024-05-24 MED ORDER — HALOPERIDOL 5 MG PO TABS: 5.0000 mg | ORAL_TABLET | Freq: Three times a day (TID) | ORAL | Status: DC | PRN

## 2024-05-24 MED ORDER — NICOTINE POLACRILEX 2 MG MT GUM
2.0000 mg | CHEWING_GUM | OROMUCOSAL | Status: DC | PRN
  Administered 2024-05-26 – 2024-05-30 (×6): 2 mg via ORAL
  Filled 2024-05-24 (×6): qty 1

## 2024-05-24 MED ORDER — ALUM & MAG HYDROXIDE-SIMETH 200-200-20 MG/5ML PO SUSP: 30.0000 mL | ORAL | Status: DC | PRN

## 2024-05-24 MED ORDER — HALOPERIDOL LACTATE 5 MG/ML IJ SOLN: 5.0000 mg | Freq: Three times a day (TID) | INTRAMUSCULAR | Status: DC | PRN

## 2024-05-24 MED ORDER — NICOTINE 14 MG/24HR TD PT24: 14.0000 mg | MEDICATED_PATCH | Freq: Every day | TRANSDERMAL | Status: DC

## 2024-05-24 MED ORDER — HYDROXYZINE HCL 25 MG PO TABS: 25.0000 mg | ORAL_TABLET | Freq: Three times a day (TID) | ORAL | Status: DC | PRN

## 2024-05-24 MED ORDER — LORAZEPAM 2 MG/ML IJ SOLN: 2.0000 mg | Freq: Three times a day (TID) | INTRAMUSCULAR | Status: DC | PRN

## 2024-05-24 MED ORDER — ONDANSETRON HCL 4 MG PO TABS: 4.0000 mg | ORAL_TABLET | Freq: Three times a day (TID) | ORAL | Status: DC | PRN

## 2024-05-24 MED ORDER — TRAZODONE HCL 50 MG PO TABS: 50.0000 mg | ORAL_TABLET | Freq: Every evening | ORAL | Status: DC | PRN

## 2024-05-24 MED ORDER — ACETAMINOPHEN 325 MG PO TABS: 650.0000 mg | ORAL_TABLET | Freq: Four times a day (QID) | ORAL | Status: DC | PRN

## 2024-05-24 MED ORDER — ALUM & MAG HYDROXIDE-SIMETH 200-200-20 MG/5ML PO SUSP: 30.0000 mL | Freq: Four times a day (QID) | ORAL | Status: DC | PRN

## 2024-05-24 MED ORDER — TRAZODONE HCL 150 MG PO TABS: 150.0000 mg | ORAL_TABLET | Freq: Every evening | ORAL | Status: DC | PRN

## 2024-05-24 MED ORDER — ARIPIPRAZOLE 10 MG PO TABS
10.0000 mg | ORAL_TABLET | Freq: Every day | ORAL | Status: DC
  Administered 2024-05-24 – 2024-05-25 (×2): 10 mg via ORAL
  Filled 2024-05-24 (×2): qty 1

## 2024-05-24 MED ORDER — DIPHENHYDRAMINE HCL 25 MG PO CAPS: 50.0000 mg | ORAL_CAPSULE | Freq: Three times a day (TID) | ORAL | Status: DC | PRN

## 2024-05-24 NOTE — ED Notes (Signed)
 Patient currently completing TTS assessment

## 2024-05-24 NOTE — Group Note (Signed)
 Date:  05/24/2024 Time:  11:34 AM  Group Topic/Focus:  Physical Wellness: Support participants in improving overall well-being through guided meditation practices that promote relaxation, body awareness, and stress reduction. This group provides a structured and supportive environment where individuals can learn and practice mindfulness techniques to enhance physical health, regulate the nervous system, and develop greater awareness of the mind-body connection. Through guided meditation, participants are encouraged to reduce physical tension, improve breathing, increase focus, and cultivate habits that support long-term physical and emotional wellness.     Participation Level:  Did Not Attend   Angel Ortiz 05/24/2024, 11:34 AM

## 2024-05-24 NOTE — ED Notes (Signed)
 Call with Thurston Essex, [phone]

## 2024-05-24 NOTE — BHH Suicide Risk Assessment (Signed)
 [facility] Admission Suicide Risk Assessment   The patient presents with acute risk factors for suicide including suicide attempt via OD, active depression, and cocaine use. She continues to present with dysphoric affect, endorsed depressed mood. Chronic risk factors include long-term stimulant use, unemployment, estrangement from family, history of prior hosptializations and suicide attempt (via OD, just prior to this admission). Although she carries protective factors of outpatient follow up, reported adherence to medications and is denying SI today, current and short-term risk of suicide remains moderate-high.  We will admit to [facility] and resume medications. Moniter through stimulant withdrawal and discuss rehab when she is better able to engage.   I certify that inpatient services furnished can reasonably be expected to improve the patient's condition.   Leita LOISE Arts, MD 05/24/2024, 12:43 PM

## 2024-05-24 NOTE — ED Notes (Signed)
 Patient signed Voluntary Consent for Treatment at this time

## 2024-05-24 NOTE — Progress Notes (Signed)
 Patient is a 38 year old female who presented to [facility] from [facility] voluntarily after an intentional OD of home meds (Gabapentin and Seroquel ) along with $20 worth of cocaine. Pt reported that this was an attempt to not wake up. Pt reported that she uses a gram a day of cocaine, and drinks alcohol every other day (approx. 2  (24 oz) beers at a time). Pt reported that she was hospitalized several years ago at [facility] and received a diagnosis of Bipolar.  Pt reported that she has been taking her meds only since last year. When asked about stressors, pt stated being unemployed, and voiced a desire to stay clean. Pt currently denies SI/HI and A/VH, but reported that she has experienced A/VH in the past. Pt presented with sad affect, calm, cooperative behavior, answered questions logically and coherently during admission interview and assessment. VS monitored and recorded. Skin check performed with MHT. Belongings searched and secured in locker. Admission paperwork completed and signed. Verbal understanding expressed. Patient was oriented to unit and schedule. Pt provided with breakfast and po fluids. Q 15 min checks initiated for safety.

## 2024-05-24 NOTE — ED Provider Notes (Signed)
 Emergency Medicine Observation Re-evaluation Note  Angel Ortiz is a 38 y.o. female, seen on rounds today.  Pt initially presented to the ED for complaints of Drug Overdose Currently, the patient is awaiting psychiatric placement.  Physical Exam  BP 103/77   Pulse (!) 106   Temp 98.6 F (37 C) (Oral)   Resp 17   SpO2 97%  Physical Exam Resting and in no acute distress  ED Course / MDM  EKG:EKG Interpretation Date/Time:  Friday May 23 2024 22:13:45 EST Ventricular Rate:  101 PR Interval:  157 QRS Duration:  81 QT Interval:  378 QTC Calculation: 490 R Axis:   37  Text Interpretation: Sinus tachycardia Abnormal R-wave progression, early transition Borderline prolonged QT interval Confirmed by Patsey Lot [phone]) on 05/23/2024 10:43:03 PM  I have reviewed the labs performed to date as well as medications administered while in observation.  Recent changes in the last 24 hours include none.  Plan  Current plan is for psychiatric placement.    Suzette Pac, MD 05/24/24 [phone]

## 2024-05-24 NOTE — Tx Team (Signed)
 Initial Treatment Plan 05/24/2024 12:10 PM [name] FMW:[id number]    PATIENT STRESSORS: Occupational concerns   Substance abuse     PATIENT STRENGTHS: Printmaker for treatment/growth  Physical Health  Supportive family/friends  Work skills    PATIENT IDENTIFIED PROBLEMS: Suicidal ideation    unemployment  Substance use  I need to know how to not use             DISCHARGE CRITERIA:  Improved stabilization in mood, thinking, and/or behavior Reduction of life-threatening or endangering symptoms to within safe limits Verbal commitment to aftercare and medication compliance Withdrawal symptoms are absent or subacute and managed without 24-hour nursing intervention  PRELIMINARY DISCHARGE PLAN: Outpatient therapy Return to previous living arrangement  PATIENT/FAMILY INVOLVEMENT: This treatment plan has been presented to and reviewed with the patient, Angel Ortiz, .  The patient has been given the opportunity to ask questions and make suggestions.  Berwyn GORMAN Acosta, RN 05/24/2024, 12:10 PM

## 2024-05-24 NOTE — Group Note (Signed)
 Date:  05/24/2024 Time:  10:01 AM  Group Topic/Focus:  Goals Group:   The focus of this group is to help patients establish daily goals to achieve during treatment and discuss how the patient can incorporate goal setting into their daily lives to aide in recovery. Orientation:   The focus of this group is to educate the patient on the purpose and policies of crisis stabilization and provide a format to answer questions about their admission.  The group details unit policies and expectations of patients while admitted.    Participation Level:  Did Not Attend   Vena Mais 05/24/2024, 10:01 AM

## 2024-05-24 NOTE — BH Assessment (Signed)
Clinician messaged Clois Comber, RN: "Hey. It's Trey with TTS. Is the pt able to engage in the assessment, if so the pt will need to be placed in a private room. Is the pt under IVC? Also is the pt medically cleared?"   Clinician awaiting response.    Redmond Pulling, MS, [facility], [facility] Triage Specialist [phone]

## 2024-05-24 NOTE — ED Notes (Signed)
 Pt belongings given to safe transport. Pt had a silver bracelet, diamond earrings, and a watch that was given to pt's family at this time.

## 2024-05-24 NOTE — Progress Notes (Signed)
°   05/24/24 2226  Psych Admission Type (Psych Patients Only)  Admission Status Voluntary  Psychosocial Assessment  Patient Complaints Depression  Eye Contact Fair  Facial Expression Flat  Affect Appropriate to circumstance  Speech Logical/coherent  Interaction Assertive  Motor Activity Other (Comment) (WDL)  Appearance/Hygiene Unremarkable  Behavior Characteristics Appropriate to situation  Mood Depressed  Thought Process  Coherency WDL  Content WDL  Delusions None reported or observed  Perception WDL  Hallucination None reported or observed  Judgment Impaired  Confusion None  Danger to Self  Current suicidal ideation? Denies  Agreement Not to Harm Self Yes  Description of Agreement verbal  Danger to Others  Danger to Others None reported or observed

## 2024-05-24 NOTE — Group Note (Signed)
 Date:  05/24/2024 Time:  10:31 AM  Group Topic/Focus:  Social Wellness; Foster [employer], enhance communication skills, and promote a sense of belonging among participants. Through supportive group activities, discussions, and shared experiences, the group encourages healthy relationships, mutual respect, and social engagement to improve overall well-being and community connectedness.  Participation Level:  Did Not Attend   Vena Mais 05/24/2024, 10:31 AM

## 2024-05-24 NOTE — H&P (Signed)
 Psychiatric Admission Assessment Adult  Patient Identification: Angel Ortiz MRN:  [id number] Date of Evaluation:  05/24/2024  Principal Diagnosis: Cocaine use disorder, severe, dependence (HCC) Diagnosis:  Principal Problem:   Cocaine use disorder, severe, dependence (HCC) Active Problems:   Unspecified mood (affective) disorder  Chief complaint: not good.   History of Present Illness:  The patient is a 38 y.o. 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 Patient presented to the ED after being found down with crack pipe next to her. She had additionally overdosed on unknown amounts of home medication including seroquel  and gabapentin as a suicide attempt. Transferred voluntarily to [facility] for further treatment after medical clearance.  Psychiatric history: gained via patient (limited due to somnolence) and chart review (reliable but limited) The patient reports one prior inpatient psychiatric admission many years ago at [facility] (2009?) where she received a bipolar diagnosis. She did not continue medications after this and has largely been unmediated (and has not followed with mental health providers) throughout adult life. In the past year has followed outpatient with [facility], again with diagnosis of bipolar, and with medications including abilify  up to 20 mg daily, gabapentin, seroquel  and amitryptyline. Prior to this past year, encounters in the ED have largely been for sequelae related to severe stimulant use (cocaine). Patient has been using cocaine for 15 years, more recently up to a gram a day. Unable to illicit specific stigmata of depression or mania due to somnolence and no clear documented symptoms on chart review.   Today: limited due to somnolence On interview today the patient  reports that she is not good. Reports feeling depressed over the past 2-3 months. Not able to disclose any recent stressors today. Does report the overdose was a suicide attempt, denies active SI now. Reports she takes Abilify  outpatient and agrees that it is helpful, states she was taking it up until 2 days ago as well as amitriptyline and gabapentin, which she requests for sleep tonight. Trazodone  has not been helpful. Does agree when specifically asked (via nodding) that cocaine has been a problem in her life and that she started using at age 70. Unable to give details on how it has effected her life except to state I don't have a job. Does not answer when asked if this dino can reach out to family. Shakes her head no regarding HI and AVH. Unable to illicit any further information as the patient continually falls asleep.   [place] Scale:  Flowsheet Row [facility] ([facility]) [facility] from 05/23/2024 in [facility] at [facility]  C-SSRS RISK CATEGORY High Risk High Risk      Alcohol Screening: 1. How often do you have a drink containing alcohol?: 4 or more times a week 2. How many drinks containing alcohol do you have on a typical day when you are drinking?: 3 or 4 3. How often do you have six or more drinks on one occasion?: Never AUDIT-C Score: 5 4. How often during the last year have you found that you were not able to stop drinking once you had started?: Never 5. How often during the last year have you failed to do what was normally expected from you because of drinking?: Never 6. How often during the last year have you needed  a first drink in the morning to get yourself going after a heavy drinking session?: Never 7. How often during the last year have you had a feeling of guilt of remorse after drinking?: Never 8. How often during the last year have you been unable to remember what happened the  night before because you had been drinking?: Never 9. Have you or someone else been injured as a result of your drinking?: No 10. Has a relative or friend or a doctor or another health worker been concerned about your drinking or suggested you cut down?: No Alcohol Use Disorder Identification Test Final Score (AUDIT): 5  Past Medical History:  Past Medical History:  Diagnosis Date   Addiction [facility])     Past Surgical History:  Procedure Laterality Date   cesarean section     x3   TONSILLECTOMY     Family History: History reviewed. No pertinent family history. Family Psychiatric  History: unable to report today Tobacco Screening: Tobacco Use History[1]  BH Tobacco Counseling     Are you interested in Tobacco Cessation Medications?  No value filed. Counseled patient on smoking cessation:  No value filed. Reason Tobacco Screening Not Completed: No value filed.       Social History:  Social History   Substance and Sexual Activity  Alcohol Use Yes   Alcohol/week: 2.0 standard drinks of alcohol   Types: 2 Cans of beer per week   Comment: every other day drinks 2 (24 oz) beets     Social History   Substance and Sexual Activity  Drug Use Yes   Types: Cocaine    Additional Social History: Domiciled with mother, unemployed, does have children (unclear at this time if they live with her and mom), smoking 1g cocaine daily; has used cocaine since age 55. Drinking every other day.   Allergies:  Allergies[2] Lab Results:  Results for orders placed or performed during the hospital encounter of 05/23/24 (from the past 48 hours)  Comprehensive metabolic panel     Status: Abnormal   Collection Time: 05/23/24 11:19 PM  Result Value Ref Range   Sodium 139 135 - 145 mmol/L   Potassium 3.6 3.5 - 5.1 mmol/L   Chloride 106 98 - 111 mmol/L   CO2 20 (L) 22 - 32 mmol/L   Glucose, Bld 83 70 - 99 mg/dL    Comment: Glucose reference range applies only to samples taken after fasting for at  least 8 hours.   BUN 5 (L) 6 - 20 mg/dL   Creatinine, Ser 9.03 0.44 - 1.00 mg/dL   Calcium 8.6 (L) 8.9 - 10.3 mg/dL   Total Protein 7.0 6.5 - 8.1 g/dL   Albumin 4.0 3.5 - 5.0 g/dL   AST 17 15 - 41 U/L   ALT 10 0 - 44 U/L   Alkaline Phosphatase 52 38 - 126 U/L   Total Bilirubin 0.3 0.0 - 1.2 mg/dL   GFR, Estimated >39 >39 mL/min    Comment: (NOTE) Calculated using the CKD-EPI Creatinine Equation (2021)    Anion gap 13 5 - 15    Comment: Performed at [facility], [address] [place], [place] [address]  Ethanol     Status: None   Collection Time: 05/23/24 11:19 PM  Result Value Ref Range   Alcohol, Ethyl (B) <15 <15 mg/dL    Comment: (NOTE) For medical purposes only. Performed at [facility], [address], [place], [place] [address]   CBC with Differential  Status: Abnormal   Collection Time: 05/23/24 11:19 PM  Result Value Ref Range   WBC 12.0 (H) 4.0 - 10.5 K/uL   RBC 4.56 3.87 - 5.11 MIL/uL   Hemoglobin 13.3 12.0 - 15.0 g/dL   HCT 60.2 63.9 - 53.9 %   MCV 87.1 80.0 - 100.0 fL   MCH 29.2 26.0 - 34.0 pg   MCHC 33.5 30.0 - 36.0 g/dL   RDW 84.2 (H) 88.4 - 84.4 %   Platelets 326 150 - 400 K/uL   nRBC 0.0 0.0 - 0.2 %   Neutrophils Relative % 82 %   Neutro Abs 9.8 (H) 1.7 - 7.7 K/uL   Lymphocytes Relative 12 %   Lymphs Abs 1.4 0.7 - 4.0 K/uL   Monocytes Relative 6 %   Monocytes Absolute 0.7 0.1 - 1.0 K/uL   Eosinophils Relative 0 %   Eosinophils Absolute 0.0 0.0 - 0.5 K/uL   Basophils Relative 0 %   Basophils Absolute 0.0 0.0 - 0.1 K/uL   Immature Granulocytes 0 %   Abs Immature Granulocytes 0.05 0.00 - 0.07 K/uL    Comment: Performed at [facility], [address] [place], [place] [address]  Urine rapid drug screen (hosp performed)     Status: Abnormal   Collection Time: 05/24/24  2:39 AM  Result Value Ref Range   Opiates NEGATIVE NEGATIVE   Cocaine POSITIVE (A) NEGATIVE   Benzodiazepines NEGATIVE NEGATIVE   Amphetamines NEGATIVE NEGATIVE    Tetrahydrocannabinol NEGATIVE NEGATIVE   Barbiturates NEGATIVE NEGATIVE   Methadone Scn, Ur NEGATIVE NEGATIVE   Fentanyl  NEGATIVE NEGATIVE    Comment: (NOTE) Drug screen is for Medical Purposes only. Positive results are preliminary only. If confirmation is needed, notify lab within 5 days.  Drug Class                 Cutoff (ng/mL) Amphetamine and metabolites 1000 Barbiturate and metabolites 200 Benzodiazepine              200 Opiates and metabolites     300 Cocaine and metabolites     300 THC                         50 Fentanyl                     5 Methadone                   300  Trazodone  is metabolized in vivo to several metabolites,  including pharmacologically active m-CPP, which is excreted in the  urine.  Immunoassay screens for amphetamines and MDMA have potential  cross-reactivity with these compounds and may provide false positive  result.  Performed at [facility], [address] [place], [place] [address]   Pregnancy, urine     Status: None   Collection Time: 05/24/24  2:39 AM  Result Value Ref Range   Preg Test, Ur NEGATIVE NEGATIVE    Comment:        THE SENSITIVITY OF THIS METHODOLOGY IS >20 mIU/mL. Performed at [facility], [address], [place], [place] [address]   Urinalysis, Routine w reflex microscopic -Urine, Catheterized     Status: Abnormal   Collection Time: 05/24/24  2:39 AM  Result Value Ref Range   Color, Urine YELLOW YELLOW   APPearance HAZY (A) CLEAR   Specific Gravity, Urine 1.006 1.005 - 1.030   pH 6.0 5.0 - 8.0   Glucose,  UA NEGATIVE NEGATIVE mg/dL   Hgb urine dipstick LARGE (A) NEGATIVE   Bilirubin Urine NEGATIVE NEGATIVE   Ketones, ur NEGATIVE NEGATIVE mg/dL   Protein, ur 30 (A) NEGATIVE mg/dL   Nitrite POSITIVE (A) NEGATIVE   Leukocytes,Ua MODERATE (A) NEGATIVE   RBC / HPF 0-5 0 - 5 RBC/hpf   WBC, UA 21-50 0 - 5 WBC/hpf   Bacteria, UA FEW (A) NONE SEEN   Squamous Epithelial / HPF 0-5 0 - 5 /HPF    Comment: Performed at  [facility], [address], [place], [place] [address]    Blood Alcohol level:  Lab Results  Component Value Date   [facility] <15 05/23/2024   ETH 229 (H) 11/13/2017    Metabolic Disorder Labs:  No results found for: HGBA1C, MPG No results found for: PROLACTIN No results found for: CHOL, TRIG, HDL, CHOLHDL, VLDL, LDLCALC  Current Medications: Current Facility-Administered Medications  Medication Dose Route Frequency Provider Last Rate Last Admin   acetaminophen  (TYLENOL ) tablet 650 mg  650 mg Oral Q6H PRN Ruby Logiudice N, MD       alum & mag hydroxide-simeth (MAALOX/MYLANTA) 200-200-20 MG/5ML suspension 30 mL  30 mL Oral Q4H PRN Towana Angel SAILOR, MD       ARIPiprazole  (ABILIFY ) tablet 10 mg  10 mg Oral Daily Towana Angel SAILOR, MD       haloperidol  (HALDOL ) tablet 5 mg  5 mg Oral TID PRN Towana Angel SAILOR, MD       And   diphenhydrAMINE  (BENADRYL ) capsule 50 mg  50 mg Oral TID PRN Towana Angel SAILOR, MD       haloperidol  lactate (HALDOL ) injection 5 mg  5 mg Intramuscular TID PRN Towana Angel SAILOR, MD       And   diphenhydrAMINE  (BENADRYL ) injection 50 mg  50 mg Intramuscular TID PRN Towana Angel SAILOR, MD       And   LORazepam  (ATIVAN ) injection 2 mg  2 mg Intramuscular TID PRN Towana Angel SAILOR, MD       hydrOXYzine  (ATARAX ) tablet 25 mg  25 mg Oral TID PRN Towana Angel SAILOR, MD       magnesium  hydroxide (MILK OF MAGNESIA) suspension 30 mL  30 mL Oral Daily PRN Towana Angel SAILOR, MD       nicotine  polacrilex (NICORETTE ) gum 2 mg  2 mg Oral PRN Towana Angel SAILOR, MD       QUEtiapine  (SEROQUEL ) tablet 50 mg  50 mg Oral QHS Towana Angel SAILOR, MD       traZODone  (DESYREL ) tablet 150 mg  150 mg Oral QHS PRN Otelia Hettinger N, MD       PTA Medications: Medications Prior to Admission  Medication Sig Dispense Refill Last Dose/Taking   amitriptyline (ELAVIL) 25 MG tablet Take 25 mg by mouth at bedtime.   Taking   ARIPiprazole  (ABILIFY ) 15 MG tablet Take 15 mg by mouth every morning.   Taking    gabapentin (NEURONTIN) 100 MG capsule Take 200 mg by mouth at bedtime.   Taking    AIMS:  ,  ,  ,  ,  ,  ,     Mental Status exam: Appearance: black female of thin body habitus, rectangular glasses on face, seen reclining under covers in bed  Eye contact: poor - mostly eyes are closed  Attitude towards examiner limited ability to engage due to somnolence, does answer questions  Psychomotor: no agitation or retardation Speech: reduced amount, quiet Language: no delays  Mood: not good Affect: congruent, blunted Thought content: denying SI and HI, no delusions expressed  Thought Process: grossly linear on limited exam  Perception: denying AVH not RTIS  Insight: fair to good Judgement: limited based on recent events   Orientation: to self and hospital  Attention/Concentration: poor due to somnolence  Memory/Cognition: not formally assessed, grossly intact on limited exam   Fund of Knowledge: Average   Musculoskeletal: Strength & Muscle Tone: within normal limits Gait & Station: normal Patient leans: N/A  Physical Exam Constitutional:      Appearance: Normal appearance.  HENT:     Head: Normocephalic and atraumatic.  Pulmonary:     Effort: Pulmonary effort is normal.  Abdominal:     General: There is no distension.  Musculoskeletal:        General: Normal range of motion.     Cervical back: Normal range of motion.  Neurological:     General: No focal deficit present.     Mental Status: She is alert.    ROS Blood pressure 103/66, pulse (!) 102, temperature 98 F (36.7 C), temperature source Oral, resp. rate (!) 6, height 5' 3 (1.6 m), weight 60.8 kg, SpO2 100%. Body mass index is 23.74 kg/m.  Treatment Plan Summary: Daily contact with patient to assess and evaluate symptoms and progress in treatment  Assessment: The patient is a 38 y.o. female with a psychiatric history currently most consistent with severe stimulant use disorder (cocaine), and unspecified mood  disorder admitted following an intentional OD on home medications as noted in HPI. The patient has carried a bipolar diagnosis given at first (and only) hospitalization at [facility] many years ago. Records are not available and she was too somnolent to go through criteria for mania and depression. Symptoms are likely confounded to a significant degree by cocaine use, which has been ongoing since age 14 and appears to have contributed to instability in housing, unemployment, over-spending and estrangement from children. Usage has continued despite attempts to quit (encounters several years ago requesting detox) and has been 1 g daily in recent weeks. At this time usage is considered severe. High suspicion for substance-induced mood symptoms, but given limited information in chart review and patient's somnolence today best working diagnosis is unspecified mood disodrer.  The patient has indicated abilify  has been an overall helpful medication for her. Will restart this at half home dose (20 mg) at 10 mg today. Hold gabapentin and amitriptyline for now, but will provide Seroquel  50 mg at bedtime for sleep given poor historical response to trazodone .   DSM-5 diagnoses: Stimulant use disorder, severe, dependance (cocaine) Unspecified mood disorder  -substance induced depression vs substance induced bipolar vs MDD vs bipolar   Plan:  Legal Status: -voluntary   Safety -q15 minute checks  -elopement, suicide and assault precautions  -daily vitals  Psychiatric Concerns  -Restart abilify  at 10 mg daily for mood stabilization  -will increase back to home dose within the next 2-3 days (20 mg) -Provide Seroquel  50 mg at bedtime for sleep -Hold gabapentin and amytriptyline for now; consider restarting once patient is more alert and better able to engage  -Trazodone  50 mg at bedtime PRN for sleep -Hydroxyzine  25 mg TID PRN for anxiety -haldol  5mg  + ativan  2 mg + benadryl  50 mg PRN for  agitation  Substance use concerns  -severe cocaine use:  -motivational interviewing  -recommend bridging directly to rehab if patient is agreeable   Nicotine  Replacement  Gum and patch   Medical  concerns -no chronic medical concerns reported, will CTM   Additional PRNs: -Tylenol  tablets 650 mg every 6 hours as needed for pain -Maalox/Mylanta suspension 30 mL every 4 hours as needed for indigestion  -Milk of Magnesia 30 mL daily as needed for constipation  Labs 05/24/24 -CMP and CBC grossly WNL (mild left shift, WBC 12) -UDS positive for cocaine -Pregnancy test negative   Psychosocial interventions  -Motivational interviewing  -daily medication management with psychiatry -Medication education regarding risks/benefits and alternatives -bedside psychotherapy as indicated  -Patient will be encouraged to participate and engage with group therapy  -Appreciate SW assistance in coordinating safe disposition  -Anticipated LOS 5-7 days     I certify that inpatient services furnished can reasonably be expected to improve the patient's condition.    Angel LOISE Arts, MD 12/13/202512:28 PM     [1]  Social History Tobacco Use  Smoking Status Every Day  Smokeless Tobacco Never  [2] No Known Allergies

## 2024-05-24 NOTE — Plan of Care (Signed)
°  Problem: Education: Goal: Knowledge of [facility] Education information/materials will improve 05/24/2024 1938 by Inda Berwyn RAMAN, RN Outcome: Progressing 05/24/2024 1937 by Inda Berwyn RAMAN, RN Outcome: Progressing Goal: Verbalization of understanding the information provided will improve 05/24/2024 1938 by Inda Berwyn RAMAN, RN Outcome: Progressing 05/24/2024 1937 by Inda Berwyn RAMAN, RN Outcome: Progressing

## 2024-05-24 NOTE — ED Notes (Signed)
 Patient alert and able to stand.  Assisted to bedside commode to provide urine sample

## 2024-05-24 NOTE — ED Notes (Signed)
 Patient more alert and will respond to voice.  Informed of need for urine sample and patient was able to shake head yes in understanding.

## 2024-05-24 NOTE — Group Note (Signed)
 Date:  05/24/2024 Time:  11:06 AM  Group Topic/Focus: Social Work Group    patient did not attend social work group  [facility] 05/24/2024, 11:06 AM

## 2024-05-24 NOTE — Plan of Care (Signed)
   Problem: Education: Goal: Knowledge of [facility] Education information/materials will improve Outcome: Progressing Goal: Verbalization of understanding the information provided will improve Outcome: Progressing

## 2024-05-24 NOTE — Group Note (Signed)
 Date:  05/24/2024 Time:  7:38 PM  Group Topic/Focus:  Documentary on [place] and it's impact on mental health.    Participation Level:  Did Not Attend   Angel Ortiz 05/24/2024, 7:38 PM

## 2024-05-24 NOTE — [facility] Group Notes (Signed)
 [facility] Group Notes:  (Nursing/MHT/Case Management/Adjunct)  Date:  05/24/2024  Time:  9:04 PM  Type of Therapy:  Wrap up group  Participation Level:  Minimal  Participation Quality:  Resistant  Affect:  Flat  Cognitive:  Lacking  Insight:  None  Engagement in Group:  None  Modes of Intervention:  Education  Summary of Progress/Problems: Pt. Attended group, did not participate/communicate.  Grayce LITTIE Essex 05/24/2024, 9:04 PM

## 2024-05-24 NOTE — Group Note (Signed)
 LCSW Group Therapy Note   Group Date: 05/24/2024 Start Time: 1000 End Time: 1045  Type of Therapy and Topic:  Group Therapy: Asking for Help  Participation Level:  Did Not Attend  Description of Group: This group discussed Asking for Help in their personal lives. Patients will explore why it can be difficult to ask for help re: importance, the difference between healthy and unhealthy help, and negative and postive outcomes/responses related to asking for help. Patients will be encouraged to identify current people in their own lives who they contact for help. Patients will be encouraged to explore safe and healthy ways to make their needs known to others in their lives.  Therapeutic Goals:  1.  Patient will identify areas in their life where making their needs known could be used to improve their life.  2.  Patient will identify signs/triggers that they need to seek help.  3.  Patient will demonstrate ability to communicate their needs and identify who to contact through discussion and/or role plays  Summary of Patient Progress:    Patient did not attend group.  Therapeutic Modalities:   Cognitive Behavioral Therapy Solution-Focused Therapy   Camelia Olden, LCSW 05/24/2024  11:43 AM

## 2024-05-24 NOTE — ED Notes (Signed)
 Pt belongings was placed in locker 4.
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
## 2024-05-25 LAB — LIPID PANEL
Cholesterol: 191 mg/dL (ref 0–200)
HDL: 67 mg/dL (ref >40)
LDL Cholesterol: 108 mg/dL — ABNORMAL HIGH (ref 0–99)
Total CHOL/HDL Ratio: 2.9 ratio
Triglycerides: 83 mg/dL (ref <150)
VLDL: 17 mg/dL (ref 0–40)

## 2024-05-25 LAB — HEMOGLOBIN A1C
Hgb A1c MFr Bld: 4.7 % — ABNORMAL LOW (ref 4.8–5.6)
Mean Plasma Glucose: 88.19 mg/dL

## 2024-05-25 LAB — TSH: TSH: 0.682 u[IU]/mL (ref 0.350–4.500)

## 2024-05-25 MED ORDER — ARIPIPRAZOLE 10 MG PO TABS
20.0000 mg | ORAL_TABLET | Freq: Every day | ORAL | Status: DC
  Administered 2024-05-26 – 2024-05-31 (×6): 20 mg via ORAL
  Filled 2024-05-25 (×6): qty 2
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
## 2024-05-25 NOTE — Progress Notes (Signed)
°   05/25/24 2040  Psych Admission Type (Psych Patients Only)  Admission Status Voluntary  Psychosocial Assessment  Patient Complaints Depression  Eye Contact Fair  Facial Expression Flat  Affect Appropriate to circumstance  Speech Logical/coherent  Interaction Assertive  Motor Activity Other (Comment) (WDL)  Appearance/Hygiene Unremarkable  Behavior Characteristics Appropriate to situation  Mood Depressed;Pleasant  Thought Process  Coherency WDL  Content WDL  Delusions None reported or observed  Perception WDL  Hallucination None reported or observed  Judgment Impaired  Confusion None  Danger to Self  Current suicidal ideation? Denies  Agreement Not to Harm Self Yes  Description of Agreement verbal  Danger to Others  Danger to Others None reported or observed

## 2024-05-25 NOTE — Plan of Care (Signed)
   Problem: Education: Goal: Emotional status will improve Outcome: Progressing Goal: Mental status will improve Outcome: Progressing

## 2024-05-25 NOTE — Group Note (Signed)
 Date:  05/25/2024 Time:  7:38 PM  Group Topic/Focus:  Self Care:   The focus of this group is to help patients understand the importance of self-care in order to improve or restore emotional, physical, spiritual, interpersonal, and financial health. Time management/priority setting.    Participation Level:  Did Not Attend  Angel Ortiz 05/25/2024, 7:38 PM

## 2024-05-25 NOTE — Progress Notes (Signed)
°   05/25/24 1200  Psych Admission Type (Psych Patients Only)  Admission Status Voluntary  Psychosocial Assessment  Patient Complaints Depression  Eye Contact Fair  Facial Expression Flat  Affect Appropriate to circumstance  Speech Logical/coherent  Interaction Assertive  Motor Activity Other (Comment) (steady gait)  Appearance/Hygiene Unremarkable  Behavior Characteristics Cooperative;Appropriate to situation  Mood Depressed  Thought Process  Coherency WDL  Content WDL  Delusions None reported or observed  Perception WDL  Hallucination None reported or observed  Judgment Impaired  Confusion None  Danger to Self  Current suicidal ideation? Denies  Agreement Not to Harm Self Yes  Description of Agreement agreed to contact staff before acting on harmful thoughts  Danger to Others  Danger to Others None reported or observed

## 2024-05-25 NOTE — Progress Notes (Signed)
(  Sleep Hours) - 5.5 hours (Any PRNs that were needed, meds refused, or side effects to meds)-  Vistaril  given (Any disturbances and when (visitation, over night)- None (Concerns raised by the patient)-  None (SI/HI/AVH)-  Denies

## 2024-05-25 NOTE — Progress Notes (Addendum)
 [facility] MD Progress Note  05/25/2024 7:33 AM JAMISON YUHASZ  MRN:  [id number]  Principal Problem: Cocaine use disorder, severe, dependence (HCC) Diagnosis: Principal Problem:   Cocaine use disorder, severe, dependence (HCC) Active Problems:   Unspecified mood (affective) disorder   MDD (major depressive disorder), severe (HCC)  Total Time spent with patient:  I personally spent 25 minutes on the unit in direct patient care. The direct patient care time included face-to-face time with the patient, reviewing the patient's chart, communicating with other professionals, and coordinating care.   Identifying Information and Past Psychiatric History:  The patient is a 38 y.o. 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 She is admitted following suicide attempt via OD on home medications (unknown amount of gabapentin and seroquel ) as well as crack cocaine.   Psychiatric history is notable for one prior inpatient psychiatric admission many years ago at [facility] (2009?) where she received a bipolar diagnosis in the context of cocaine use. She did not continue medications after this and has largely been unmediated throughout adult life (both before and after this admission). In the past year has followed outpatient with [facility], again with diagnosis of bipolar, and with medications including abilify  up to 20 mg daily, gabapentin, seroquel  and amitryptyline. Prior to this past year, encounters in the ED have largely been for sequelae related to severe stimulant use (cocaine). Patient has been using cocaine for 15 years, more recently up to a gram a day and usage has been considered severe and driving mental health concerns.   Interval events: The patient was evaluated at [facility] yesterday, largely somnolent,  continuing to endorse depressed mood. She did attend wrap-up group but did not participate. Documented to be objectively depressed, denying SI, HI and AVH. Home abilify  was restarted at 10 mg and he was compliant with this. Collected labs this morning: A1c 4.7, Lipid panel with mildly elevated LDL (108) other parameters WNL, TSH WNL. BP 113/76 (BP Location: Left Arm)   Pulse 95   Temp 97.9 F (36.6 C) (Oral)   Resp 16   Ht 5' 3 (1.6 m)   Wt 60.8 kg   SpO2 100%   BMI 23.74 kg/m   Interview today: Today the patient reports she is okay. Did sleep well last night and feels a bit better than yesterday. Notes that December is always a tough time of year for her and so is her birthday, at which time she becomes tearful and does not wish to disclose additional concerns related to this date. She had been feeling more depressed and suicidal over the past few weeks, but overall has been depressed for months. We discussed cocaine and its effect on the brain, how it leads to depression. Patient agrees that she needs to get sober but states it's tough because the guy she is seeing uses and deals and most of her friends use. They are her support network and would not be easy to cut out of her life, although she notes they wouldn't support her sobriety because they don't care. She would be open to SAIOP. Does want to continue on her abilify  as she believes it has been helpful for her overall. She believes she did have at least one manic episode where she was not sleeping well for days, was rapidly cycling in her thoughts and was saying mean  things to people. Does endorse cocaine use at this time. No SI, HI or AVH reported today.    Past Medical History:  Past Medical History:  Diagnosis Date   Addiction [facility])     Past Surgical History:  Procedure Laterality Date   cesarean section     x3   TONSILLECTOMY     Family History: History reviewed. No pertinent family history.  Social History:  Social  History   Substance and Sexual Activity  Alcohol Use Yes   Alcohol/week: 2.0 standard drinks of alcohol   Types: 2 Cans of beer per week   Comment: every other day drinks 2 (24 oz) beets     Social History   Substance and Sexual Activity  Drug Use Yes   Types: Cocaine    Social History   Socioeconomic History   Marital status: Single    Spouse name: Not on file   Number of children: Not on file   Years of education: Not on file   Highest education level: Not on file  Occupational History   Not on file  Tobacco Use   Smoking status: Every Day   Smokeless tobacco: Never  Substance and Sexual Activity   Alcohol use: Yes    Alcohol/week: 2.0 standard drinks of alcohol    Types: 2 Cans of beer per week    Comment: every other day drinks 2 (24 oz) beets   Drug use: Yes    Types: Cocaine   Sexual activity: Yes  Other Topics Concern   Not on file  Social History Narrative   Not on file   Social Drivers of Health   Tobacco Use: High Risk (05/24/2024)   Patient History    Smoking Tobacco Use: Every Day    Smokeless Tobacco Use: Never    Passive Exposure: Not on file  Financial Resource Strain: Not on file  Food Insecurity: No Food Insecurity (05/24/2024)   Epic    Worried About Programme Researcher, Broadcasting/film/video in the Last Year: Never true    Ran Out of Food in the Last Year: Never true  Transportation Needs: No Transportation Needs (05/24/2024)   Epic    Lack of Transportation (Medical): No    Lack of Transportation (Non-Medical): No  Physical Activity: Not on file  Stress: Not on file  Social Connections: Not on file  Depression (EYV7-0): Not on file  Alcohol Screen: Low Risk (05/24/2024)   Alcohol Screen    Last Alcohol Screening Score (AUDIT): 5  Housing: Low Risk (05/24/2024)   Epic    Unable to Pay for Housing in the Last Year: No    Number of Times Moved in the Last Year: 0    Homeless in the Last Year: No  Utilities: Not At Risk (05/24/2024)   Epic    Threatened  with loss of utilities: No  Health Literacy: Not on file    Current Medications: Current Facility-Administered Medications  Medication Dose Route Frequency Provider Last Rate Last Admin   acetaminophen  (TYLENOL ) tablet 650 mg  650 mg Oral Q6H PRN Lakeria Starkman N, MD       alum & mag hydroxide-simeth (MAALOX/MYLANTA) 200-200-20 MG/5ML suspension 30 mL  30 mL Oral Q4H PRN Towana Leita SAILOR, MD       ARIPiprazole  (ABILIFY ) tablet 10 mg  10 mg Oral Daily Seirra Kos N, MD   10 mg at 05/24/24 1321   haloperidol  (HALDOL ) tablet 5 mg  5 mg Oral TID PRN Towana Leita SAILOR,  MD       And   diphenhydrAMINE  (BENADRYL ) capsule 50 mg  50 mg Oral TID PRN Aster Eckrich N, MD       haloperidol  lactate (HALDOL ) injection 10 mg  10 mg Intramuscular TID PRN White, Patrice L, NP       And   diphenhydrAMINE  (BENADRYL ) injection 50 mg  50 mg Intramuscular TID PRN White, Patrice L, NP       And   LORazepam  (ATIVAN ) injection 2 mg  2 mg Intramuscular TID PRN White, Patrice L, NP       haloperidol  lactate (HALDOL ) injection 5 mg  5 mg Intramuscular TID PRN Towana Leita SAILOR, MD       And   diphenhydrAMINE  (BENADRYL ) injection 50 mg  50 mg Intramuscular TID PRN Towana Leita SAILOR, MD       And   LORazepam  (ATIVAN ) injection 2 mg  2 mg Intramuscular TID PRN Towana Leita SAILOR, MD       hydrOXYzine  (ATARAX ) tablet 25 mg  25 mg Oral TID PRN Towana Leita SAILOR, MD   25 mg at 05/24/24 2106   magnesium  hydroxide (MILK OF MAGNESIA) suspension 30 mL  30 mL Oral Daily PRN Towana Leita SAILOR, MD       nicotine  polacrilex (NICORETTE ) gum 2 mg  2 mg Oral PRN Towana Leita SAILOR, MD       QUEtiapine  (SEROQUEL ) tablet 50 mg  50 mg Oral QHS Towana Leita SAILOR, MD   50 mg at 05/24/24 2106   traZODone  (DESYREL ) tablet 50 mg  50 mg Oral QHS PRN Teresa Wyline CROME, NP        Lab Results:  Results for orders placed or performed during the hospital encounter of 05/24/24 (from the past 48 hours)  Lipid panel     Status: Abnormal   Collection Time: 05/25/24   6:38 AM  Result Value Ref Range   Cholesterol 191 0 - 200 mg/dL    Comment:        ATP III CLASSIFICATION:  <200     mg/dL   Desirable  799-760  mg/dL   Borderline High  >=759    mg/dL   High           Triglycerides 83 <150 mg/dL   HDL 67 >59 mg/dL   Total CHOL/HDL Ratio 2.9 RATIO   VLDL 17 0 - 40 mg/dL   LDL Cholesterol 891 (H) 0 - 99 mg/dL    Comment:        Total Cholesterol/HDL:CHD Risk Coronary Heart Disease Risk Table                     Men   Women  1/2 Average Risk   3.4   3.3  Average Risk       5.0   4.4  2 X Average Risk   9.6   7.1  3 X Average Risk  23.4   11.0        Use the calculated Patient Ratio above and the CHD Risk Table to determine the patient's CHD Risk.        ATP III CLASSIFICATION (LDL):  <100     mg/dL   Optimal  899-870  mg/dL   Near or Above                    Optimal  130-159  mg/dL   Borderline  839-810  mg/dL   High  >809  mg/dL   Very High Performed at [facility], [address] [place], [place] [address]     Blood Alcohol level:  Lab Results  Component Value Date   [facility] <15 05/23/2024   ETH 229 (H) 11/13/2017    Metabolic Disorder Labs: No results found for: HGBA1C, MPG No results found for: PROLACTIN Lab Results  Component Value Date   CHOL 191 05/25/2024   TRIG 83 05/25/2024   HDL 67 05/25/2024   CHOLHDL 2.9 05/25/2024   VLDL 17 05/25/2024   LDLCALC 108 (H) 05/25/2024    Physical Findings:  Mental Status exam: Appearance: black female of thin body habitus, seen sitting up comfortably in bed  Eye contact: fair - improved  Attitude towards examiner cooperative overall, somewhat withdrawn Psychomotor: no agitation or retardation Speech: reduced amount, quiet Language: no delays  Mood: okay, I've been depressed Affect: congruent, blunted Thought content: denying SI and HI, no delusions expressed  Thought Process: grossly linear on limited exam  Perception: denying AVH not RTIS   Insight: fair to good Judgement: limited based on recent events    Orientation: to self, date, situation, and place Attention/Concentration: good - attends to interview Memory/Cognition: not formally assessed, grossly intact on conversation Fund of Knowledge: Average    Musculoskeletal: Strength & Muscle Tone: within normal limits Gait & Station: normal Patient leans: N/A   Physical Exam Constitutional:      Appearance: Normal appearance.  HENT:     Head: Normocephalic and atraumatic.  Pulmonary:     Effort: Pulmonary effort is normal. No respiratory distress.  Abdominal:     General: There is no distension.  Musculoskeletal:        General: Normal range of motion.  Neurological:     General: No focal deficit present.     Mental Status: She is alert.    Review of Systems  All other systems reviewed and are negative.  Blood pressure 113/76, pulse 95, temperature 97.9 F (36.6 C), temperature source Oral, resp. rate 16, height 5' 3 (1.6 m), weight 60.8 kg, SpO2 100%. Body mass index is 23.74 kg/m.   Treatment Plan Summary: Daily contact with patient to assess and evaluate symptoms and progress in treatment  Assessment: The patient is a 38 y.o. female with a psychiatric history currently most consistent with severe stimulant use disorder (cocaine), and unspecified mood disorder admitted following an intentional OD on home medications (gabapentin and seroquel ) and crack cocaine. The patient has carried a bipolar diagnosis given at first (and only) hospitalization at [facility] many years ago. Records are not available, however notably symptoms are confounded to a significant degree by cocaine use, which has been ongoing since age 30. Cocaine use appears to have contributed to instability in housing, unemployment, over-spending and estrangement from children. Usage has continued despite attempts to quit (encounters several years ago requesting detox) and has been 1 g daily in  recent weeks. At this time usage is considered severe. High suspicion for substance-induced mood symptoms (primarily depression, but some reported manic/hypomanic symptoms in the past), given no sobriety during any prior mood episodes, but underlying primary affective disorder cannot be entirely ruled out. Best working diagnosis is unspecified mood disodrer.   The patient has indicated abilify  has been an overall helpful medication for her. This was restarted on 12/13 at 10 mg ( home dose 20 mg). Given recent OD and somnolence, gabapentin and amitriptyline have been held now, but provided Seroquel  50 mg at bedtime for sleep given  poor historical response to trazodone . Today the patient was more alert. Voiced ongoing recent depression and that OD was a true suicide attempt. Was open to sobriety through [facility] and voiced preference to continue on abilify . Will increase back to 20 mg tomorrow.  She slept well last night with seroquel  50 mg alone, so will continue to hold other medications at this time.    DSM-5 diagnoses: Stimulant use disorder, severe, dependance (cocaine) Unspecified mood disorder             -substance induced depression vs substance induced bipolar vs MDD vs bipolar     Plan:   Legal Status: -voluntary    Safety -q15 minute checks  -elopement, suicide and assault precautions  -daily vitals   Psychiatric Concerns  -Continue abilify  10 mg daily for mood stabilization             -increase back to home dose of 20 mg tomorrow -Seroquel  50 mg at bedtime for sleep  -Continue to hold gabapentin and amytriptyline for now; s/p OD and patient was still somnolent as of 12/13   -Trazodone  50 mg at bedtime PRN for sleep -Hydroxyzine  25 mg TID PRN for anxiety -haldol  5mg  + ativan  2 mg + benadryl  50 mg PRN for agitation   Substance use concerns  -severe cocaine use:             -motivational interviewing             -recommend bridging directly to rehab if patient is agreeable     Nicotine  Replacement  Gum and patch    Medical concerns -no chronic medical concerns reported, will CTM    Additional PRNs: -Tylenol  tablets 650 mg every 6 hours as needed for pain -Maalox/Mylanta suspension 30 mL every 4 hours as needed for indigestion  -Milk of Magnesia 30 mL daily as needed for constipation   Labs 05/24/24 -CMP and CBC grossly WNL (mild left shift, WBC 12) -UDS positive for cocaine -Pregnancy test negative   05/25/24 -A1c 4.7 -Lipid panel with mildly elevated LDL (108) other parameters WNL -TSH WNL   Psychosocial interventions  -Motivational interviewing  -daily medication management with psychiatry -Medication education regarding risks/benefits and alternatives -bedside psychotherapy as indicated  -Patient will be encouraged to participate and engage with group therapy  -Appreciate SW assistance in coordinating safe disposition  -Anticipated LOS 4-6 days   Leita LOISE Arts, MD 05/25/2024, 7:33 AM

## 2024-05-26 ENCOUNTER — Encounter (HOSPITAL_COMMUNITY): Payer: Self-pay

## 2024-05-26 MED ORDER — WHITE PETROLATUM EX OINT
TOPICAL_OINTMENT | CUTANEOUS | Status: AC
  Filled 2024-05-26: qty 5

## 2024-05-26 NOTE — Group Note (Signed)
 Date:  05/26/2024 Time:  9:39 AM  Group Topic/Focus:  Goals Group:   The focus of this group is to help patients establish daily goals to achieve during treatment and discuss how the patient can incorporate goal setting into their daily lives to aide in recovery. Orientation:   The focus of this group is to educate the patient on the purpose and policies of crisis stabilization and provide a format to answer questions about their admission.  The group details unit policies and expectations of patients while admitted.    Participation Level:  Did Not Attend   Lauris JONELLE Morales 05/26/2024, 9:39 AM

## 2024-05-26 NOTE — Group Note (Signed)
 Date:  05/26/2024 Time:  3:53 PM  Group Topic/Focus: Occupational Therapy    Pt did attend occupational therapy group  Angel Ortiz R Malique Driskill 05/26/2024, 3:53 PM

## 2024-05-26 NOTE — [facility] Group Notes (Signed)
 [facility] Group Notes:  (Nursing/MHT/Case Management/Adjunct)  Date:  05/26/2024  Time:  8:11 PM  Type of Therapy:  AA Group  Participation Level:  Active  Participation Quality:  Appropriate  Affect:  Appropriate  Cognitive:  Appropriate  Insight:  Appropriate  Engagement in Group:  Engaged  Modes of Intervention:  Education  Summary of Progress/Problems: Attended AA meeting.  Grayce LITTIE Essex 05/26/2024, 8:11 PM

## 2024-05-26 NOTE — Plan of Care (Signed)
 Pt was out in the milieu during the day acting appropriately. Went to [employer] and ate adequately. Attending group activity and participated. Denies SI/HI/SH/paranoia/AVH. Will continue to monitor.

## 2024-05-26 NOTE — Group Note (Signed)
 Occupational Therapy Group Note   Group Topic:Goal Setting  Group Date: 05/26/2024 Start Time: 1500 End Time: 1544 Facilitators: Dot [place] MATSU, OT   Group Description: Group encouraged engagement and participation through discussion focused on goal setting. Group members were introduced to goal-setting using the SMART Goal framework, identifying goals as Specific, Measureable, Acheivable, Relevant, and Time-Bound. Group members took time from group to create their own personal goal reflecting the SMART goal template and shared for review by peers and OT.    Therapeutic Goal(s):  Identify at least one goal that fits the SMART framework    Participation Level: Engaged   Participation Quality: Independent   Behavior: Appropriate   Speech/Thought Process: Relevant   Affect/Mood: Appropriate   Insight: Fair   Judgement: Fair      Modes of Intervention: Education  Patient Response to Interventions:  Attentive   Plan: Continue to engage patient in OT groups 2 - 3x/week.  05/26/2024  [place] MATSU Dot, OT  Angel Ortiz, OT

## 2024-05-26 NOTE — Progress Notes (Signed)
(  Sleep Hours) - 7.25 hours (Any PRNs that were needed, meds refused, or side effects to meds)-  Vistaril  given (Any disturbances and when (visitation, over night)- None (Concerns raised by the patient)-  None (SI/HI/AVH)-  Denies

## 2024-05-26 NOTE — Group Note (Signed)
 Date:  05/26/2024 Time:  3:07 PM  Group Topic/Focus: Grief and Loss Group    Pt did not attend grief and loss group with the chaplain  Codee Bloodworth R Mattison Stuckey 05/26/2024, 3:07 PM

## 2024-05-26 NOTE — Group Note (Signed)
 Date:  05/26/2024 Time:  12:50 PM  Group Topic/Focus:  Emotional Wellness: explore and understand the deeper layers of anger by using the anger iceberg model. This metaphor helps us  recognize that anger, like an iceberg, often only shows a small portion of what's truly beneath the surface. By delving into this concept, participants will develop a more nuanced view of their emotional experiences, enhancing self-awareness and promoting healthier emotional expression. Physical Wellness: educate participants on the vital role that sleep plays in overall physical wellness by watching a TED Talk on the science of sleep and engaging in a thoughtful discussion afterward. The session will focus on how sleep impacts various aspects of health, including cognitive function, physical recovery, immune health, and emotional well-being, and how participants can incorporate better sleep practices into their lives to improve their physical wellness.  Participation Level:  Did Not Attend  Murphy Bundick R Jahid Weida 05/26/2024, 12:50 PM

## 2024-05-26 NOTE — [facility] Group Notes (Signed)
 Persephonie declined to go to goals group

## 2024-05-26 NOTE — Progress Notes (Signed)
 [facility] MD Progress Note  05/26/2024 8:04 AM Angel Ortiz  MRN:  [id number]  Principal Problem: Cocaine use disorder, severe, dependence (HCC) Diagnosis: Principal Problem:   Cocaine use disorder, severe, dependence (HCC) Active Problems:   Unspecified mood (affective) disorder   MDD (major depressive disorder), severe (HCC)  Total Time spent with patient:  I personally spent 25 minutes on the unit in direct patient care. The direct patient care time included face-to-face time with the patient, reviewing the patient's chart, communicating with other professionals, and coordinating care.   Identifying Information and Past Psychiatric History:  The patient is a 38 y.o. 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 She is admitted following suicide attempt via OD on home medications (unknown amount of gabapentin and seroquel ) as well as crack cocaine.   Psychiatric history is notable for one prior inpatient psychiatric admission many years ago at [facility] (2009?) where she received a bipolar diagnosis in the context of cocaine use. She did not continue medications after this and has largely been unmediated throughout adult life (both before and after this admission). In the past year has followed outpatient with [facility], again with diagnosis of bipolar, and with medications including abilify  up to 20 mg daily, gabapentin, seroquel  and amitryptyline. Prior to this past year, encounters in the ED have largely been for sequelae related to severe stimulant use (cocaine). Patient has been using cocaine for 15 years, more recently up to a gram a day and usage has been considered severe and driving mental health concerns.   Interval events: The patient was documented to continue to endorse depression,  isolative to room and not attending many groups. Was denying SI/HI and AVH last evening. Mediation compliant. No behavioral concerns reported. Documented to sleep 7.25 hrs overnight. BP 98/65 (BP Location: Left Arm)   Pulse 89   Temp 98.4 F (36.9 C) (Oral)   Resp 16   Ht 5' 3 (1.6 m)   Wt 60.8 kg   SpO2 100%   BMI 23.74 kg/m   Interview today: Today the patient reports she is tired. She has been feeling tired since she got here. She is not sure if she will be feeling up to coming out for groups or not today. Asks how long she is likely to be in the hospital and let her know it is 5 days on average, but we recommend coming out and working programming. Voiced understanding and said she would try. States sleep was okay. No SI, HI or AVH reported today.     Past Medical History:  Past Medical History:  Diagnosis Date   Addiction [facility]     Past Surgical History:  Procedure Laterality Date   cesarean section     x3   TONSILLECTOMY     Family History: History reviewed. No pertinent family history.  Social History:  Social History   Substance and Sexual Activity  Alcohol Use Yes   Alcohol/week: 2.0 standard drinks of alcohol   Types: 2 Cans of beer per week   Comment: every other day drinks 2 (24 oz) beets     Social History   Substance and Sexual Activity  Drug Use Yes   Types: Cocaine    Social History   Socioeconomic History   Marital status: Single    Spouse name: Not on file  Number of children: Not on file   Years of education: Not on file   Highest education level: Not on file  Occupational History   Not on file  Tobacco Use   Smoking status: Every Day   Smokeless tobacco: Never  Substance and Sexual Activity   Alcohol use: Yes    Alcohol/week: 2.0 standard drinks of alcohol    Types: 2 Cans of beer per week    Comment: every other day drinks 2 (24 oz) beets   Drug use: Yes    Types: Cocaine   Sexual activity: Yes  Other Topics Concern   Not on  file  Social History Narrative   Not on file   Social Drivers of Health   Tobacco Use: High Risk (05/24/2024)   Patient History    Smoking Tobacco Use: Every Day    Smokeless Tobacco Use: Never    Passive Exposure: Not on file  Financial Resource Strain: Not on file  Food Insecurity: No Food Insecurity (05/24/2024)   Epic    Worried About Programme Researcher, Broadcasting/film/video in the Last Year: Never true    Ran Out of Food in the Last Year: Never true  Transportation Needs: No Transportation Needs (05/24/2024)   Epic    Lack of Transportation (Medical): No    Lack of Transportation (Non-Medical): No  Physical Activity: Not on file  Stress: Not on file  Social Connections: Not on file  Depression (EYV7-0): Not on file  Alcohol Screen: Low Risk (05/24/2024)   Alcohol Screen    Last Alcohol Screening Score (AUDIT): 5  Housing: Low Risk (05/24/2024)   Epic    Unable to Pay for Housing in the Last Year: No    Number of Times Moved in the Last Year: 0    Homeless in the Last Year: No  Utilities: Not At Risk (05/24/2024)   Epic    Threatened with loss of utilities: No  Health Literacy: Not on file    Current Medications: Current Facility-Administered Medications  Medication Dose Route Frequency Provider Last Rate Last Admin   acetaminophen  (TYLENOL ) tablet 650 mg  650 mg Oral Q6H PRN Keshav Winegar N, MD       alum & mag hydroxide-simeth (MAALOX/MYLANTA) 200-200-20 MG/5ML suspension 30 mL  30 mL Oral Q4H PRN Towana Leita SAILOR, MD       ARIPiprazole  (ABILIFY ) tablet 20 mg  20 mg Oral Daily Towana Leita SAILOR, MD       haloperidol  (HALDOL ) tablet 5 mg  5 mg Oral TID PRN Towana Leita SAILOR, MD       And   diphenhydrAMINE  (BENADRYL ) capsule 50 mg  50 mg Oral TID PRN Towana Leita SAILOR, MD       haloperidol  lactate (HALDOL ) injection 10 mg  10 mg Intramuscular TID PRN White, Patrice L, NP       And   diphenhydrAMINE  (BENADRYL ) injection 50 mg  50 mg Intramuscular TID PRN White, Patrice L, NP       And    LORazepam  (ATIVAN ) injection 2 mg  2 mg Intramuscular TID PRN White, Patrice L, NP       haloperidol  lactate (HALDOL ) injection 5 mg  5 mg Intramuscular TID PRN Towana Leita SAILOR, MD       And   diphenhydrAMINE  (BENADRYL ) injection 50 mg  50 mg Intramuscular TID PRN Towana Leita SAILOR, MD       And   LORazepam  (ATIVAN ) injection 2 mg  2 mg Intramuscular TID  PRN Deyton Ellenbecker N, MD       hydrOXYzine  (ATARAX ) tablet 25 mg  25 mg Oral TID PRN Elishah Ashmore N, MD   25 mg at 05/25/24 2103   magnesium  hydroxide (MILK OF MAGNESIA) suspension 30 mL  30 mL Oral Daily PRN Ailsa Mireles N, MD   30 mL at 05/25/24 0848   nicotine  polacrilex (NICORETTE ) gum 2 mg  2 mg Oral PRN Towana Leita SAILOR, MD       QUEtiapine  (SEROQUEL ) tablet 50 mg  50 mg Oral QHS Nesiah Jump N, MD   50 mg at 05/25/24 2103   traZODone  (DESYREL ) tablet 50 mg  50 mg Oral QHS PRN Teresa Wyline CROME, NP        Lab Results:  Results for orders placed or performed during the hospital encounter of 05/24/24 (from the past 48 hours)  TSH     Status: None   Collection Time: 05/25/24  6:38 AM  Result Value Ref Range   TSH 0.682 0.350 - 4.500 uIU/mL    Comment: Performed at [facility], [address] [place], [place] [address]  Lipid panel     Status: Abnormal   Collection Time: 05/25/24  6:38 AM  Result Value Ref Range   Cholesterol 191 0 - 200 mg/dL    Comment:        ATP III CLASSIFICATION:  <200     mg/dL   Desirable  799-760  mg/dL   Borderline High  >=759    mg/dL   High           Triglycerides 83 <150 mg/dL   HDL 67 >59 mg/dL   Total CHOL/HDL Ratio 2.9 RATIO   VLDL 17 0 - 40 mg/dL   LDL Cholesterol 891 (H) 0 - 99 mg/dL    Comment:        Total Cholesterol/HDL:CHD Risk Coronary Heart Disease Risk Table                     Men   Women  1/2 Average Risk   3.4   3.3  Average Risk       5.0   4.4  2 X Average Risk   9.6   7.1  3 X Average Risk  23.4   11.0        Use the calculated Patient Ratio above and  the CHD Risk Table to determine the patient's CHD Risk.        ATP III CLASSIFICATION (LDL):  <100     mg/dL   Optimal  899-870  mg/dL   Near or Above                    Optimal  130-159  mg/dL   Borderline  839-810  mg/dL   High  >809     mg/dL   Very High Performed at [facility], [address], [place], [place] [address]   Hemoglobin A1c     Status: Abnormal   Collection Time: 05/25/24  6:38 AM  Result Value Ref Range   Hgb A1c MFr Bld 4.7 (L) 4.8 - 5.6 %    Comment: (NOTE) Diagnosis of Diabetes The following HbA1c ranges recommended by the American Diabetes Association (ADA) may be used as an aid in the diagnosis of diabetes mellitus.  Hemoglobin             Suggested A1C NGSP%  Diagnosis  <5.7                   Non Diabetic  5.7-6.4                Pre-Diabetic  >6.4                   Diabetic  <7.0                   Glycemic control for                       adults with diabetes.     Mean Plasma Glucose 88.19 mg/dL    Comment: Performed at [facility], [address], [place], [place] [address]    Blood Alcohol level:  Lab Results  Component Value Date   [facility] <15 05/23/2024   ETH 229 (H) 11/13/2017    Metabolic Disorder Labs: Lab Results  Component Value Date   HGBA1C 4.7 (L) 05/25/2024   MPG 88.19 05/25/2024   No results found for: PROLACTIN Lab Results  Component Value Date   CHOL 191 05/25/2024   TRIG 83 05/25/2024   HDL 67 05/25/2024   CHOLHDL 2.9 05/25/2024   VLDL 17 05/25/2024   LDLCALC 108 (H) 05/25/2024    Physical Findings:  Mental Status exam: Appearance: black female of thin body habitus, seen laying under the covers in bed  Eye contact: fair Attitude towards examiner cooperative, remains somewhat withdrawn Psychomotor: no agitation or retardation Speech: reduced amount, quiet Language: no delays  Mood: tired Affect: congruent, blunted and fatigued  Thought content: denying SI and HI,  no delusions expressed  Thought Process: grossly linear on limited exam  Perception: denying AVH not RTIS  Insight: fair to good Judgement: limited based on recent events    Orientation: to self, date, situation, and place Attention/Concentration: good - attends to interview Memory/Cognition: not formally assessed, grossly intact on conversation Fund of Knowledge: Average    Musculoskeletal: Strength & Muscle Tone: within normal limits Gait & Station: normal Patient leans: N/A   Physical Exam Constitutional:      Appearance: Normal appearance.  HENT:     Head: Normocephalic and atraumatic.  Pulmonary:     Effort: Pulmonary effort is normal. No respiratory distress.  Abdominal:     General: There is no distension.  Musculoskeletal:        General: Normal range of motion.  Neurological:     General: No focal deficit present.     Mental Status: She is alert.    Review of Systems  All other systems reviewed and are negative.  Blood pressure 98/65, pulse 89, temperature 98.4 F (36.9 C), temperature source Oral, resp. rate 16, height 5' 3 (1.6 m), weight 60.8 kg, SpO2 100%. Body mass index is 23.74 kg/m.   Treatment Plan Summary: Daily contact with patient to assess and evaluate symptoms and progress in treatment  Assessment: The patient is a 38 y.o. female with a psychiatric history currently most consistent with severe stimulant use disorder (cocaine), and unspecified mood disorder admitted following an intentional OD on home medications (gabapentin and seroquel ) and crack cocaine. The patient has carried a bipolar diagnosis given at first (and only) hospitalization at [facility] many years ago. Records are not available, however notably symptoms are confounded to a significant degree by cocaine use, which has been ongoing since age 28. Cocaine use appears to have contributed to instability in  housing, unemployment, over-spending and estrangement from children. Usage has  continued despite attempts to quit (encounters several years ago requesting detox) and has been 1 g daily in recent weeks. At this time usage is considered severe. High suspicion for substance-induced mood symptoms (primarily depression, but some reported manic/hypomanic symptoms in the past), given no sobriety during any prior mood episodes, but underlying primary affective disorder cannot be entirely ruled out. Best working diagnosis is unspecified mood disodrer.   The patient has indicated abilify  has been an overall helpful medication for her. This was restarted on 12/13 at 10 mg and increased back to home dose today (12/15). Given recent OD and somnolence, gabapentin and amitriptyline have been held during this hospitalization with adequate sleep so far on Seroquel  50 mg at bedtime. Today the patient continued to present as blunted and fatigued - has largely been in bed since admission. Likely in part related to coming off stimulants as well as ongoing depression. Abilify  has been increased back to home dose of 20 mg today. If no improvements by tomorrow could consider restarting amitriptyline (previously on evening time for sleep) vs another adjuvant.    Was open to sobriety through [facility], not interested in inpatient rehab.     DSM-5 diagnoses: Stimulant use disorder, severe, dependance (cocaine) Unspecified mood disorder             -substance induced depression vs substance induced bipolar vs MDD vs bipolar     Plan:   Legal Status: -voluntary    Safety -q15 minute checks  -elopement, suicide and assault precautions  -daily vitals   Psychiatric Concerns  -Increase abilify  back to home dose: 20 mg daily for mood stabilization -Continue Seroquel  50 mg at bedtime for sleep  -Continue to hold gabapentin and amitriptyline for now; s/p OD and currently sleeping well with seroquel  alone. Could restart amitriptyline tomorrow for adjuvant and sleep vs a different agent in patient who is at  high risk of OD.   -Trazodone  50 mg at bedtime PRN for sleep -Hydroxyzine  25 mg TID PRN for anxiety -haldol  5mg  + ativan  2 mg + benadryl  50 mg PRN for agitation   Substance use concerns  -severe cocaine use:             -motivational interviewing             -recommend bridging directly to rehab if patient is agreeable    Nicotine  Replacement  Gum and patch    Medical concerns -no chronic medical concerns reported, will CTM    Additional PRNs: -Tylenol  tablets 650 mg every 6 hours as needed for pain -Maalox/Mylanta suspension 30 mL every 4 hours as needed for indigestion  -Milk of Magnesia 30 mL daily as needed for constipation   Labs 05/24/24 -CMP and CBC grossly WNL (mild left shift, WBC 12) -UDS positive for cocaine -Pregnancy test negative   05/25/24 -A1c 4.7 -Lipid panel with mildly elevated LDL (108) other parameters WNL -TSH WNL   Psychosocial interventions  -Motivational interviewing  -daily medication management with psychiatry -Medication education regarding risks/benefits and alternatives -bedside psychotherapy as indicated  -Patient will be encouraged to participate and engage with group therapy  -Appreciate SW assistance in coordinating safe disposition  -Anticipated LOS 3-5 days   Leita LOISE Arts, MD 05/26/2024, 8:04 AM

## 2024-05-26 NOTE — BH IP Treatment Plan (Signed)
 Interdisciplinary Treatment and Diagnostic Plan Update  05/26/2024 Time of Session: 10:15 AM Angel Ortiz MRN: [id number]  Principal Diagnosis: Cocaine use disorder, severe, dependence (HCC)  Secondary Diagnoses: Principal Problem:   Cocaine use disorder, severe, dependence (HCC) Active Problems:   Unspecified mood (affective) disorder   MDD (major depressive disorder), severe (HCC)   Current Medications:  Current Facility-Administered Medications  Medication Dose Route Frequency Provider Last Rate Last Admin   acetaminophen  (TYLENOL ) tablet 650 mg  650 mg Oral Q6H PRN Butler, Laura N, MD       alum & mag hydroxide-simeth (MAALOX/MYLANTA) 200-200-20 MG/5ML suspension 30 mL  30 mL Oral Q4H PRN Towana Leita SAILOR, MD       ARIPiprazole  (ABILIFY ) tablet 20 mg  20 mg Oral Daily Butler, Laura N, MD   20 mg at 05/26/24 0805   haloperidol  (HALDOL ) tablet 5 mg  5 mg Oral TID PRN Towana Leita SAILOR, MD       And   diphenhydrAMINE  (BENADRYL ) capsule 50 mg  50 mg Oral TID PRN Towana Leita SAILOR, MD       haloperidol  lactate (HALDOL ) injection 10 mg  10 mg Intramuscular TID PRN White, Patrice L, NP       And   diphenhydrAMINE  (BENADRYL ) injection 50 mg  50 mg Intramuscular TID PRN White, Patrice L, NP       And   LORazepam  (ATIVAN ) injection 2 mg  2 mg Intramuscular TID PRN White, Patrice L, NP       haloperidol  lactate (HALDOL ) injection 5 mg  5 mg Intramuscular TID PRN Towana Leita SAILOR, MD       And   diphenhydrAMINE  (BENADRYL ) injection 50 mg  50 mg Intramuscular TID PRN Towana Leita SAILOR, MD       And   LORazepam  (ATIVAN ) injection 2 mg  2 mg Intramuscular TID PRN Towana Leita SAILOR, MD       hydrOXYzine  (ATARAX ) tablet 25 mg  25 mg Oral TID PRN Towana Leita SAILOR, MD   25 mg at 05/25/24 2103   magnesium  hydroxide (MILK OF MAGNESIA) suspension 30 mL  30 mL Oral Daily PRN Towana Leita SAILOR, MD   30 mL at 05/25/24 0848   nicotine  polacrilex (NICORETTE ) gum 2 mg  2 mg Oral PRN Towana Leita SAILOR, MD        QUEtiapine  (SEROQUEL ) tablet 50 mg  50 mg Oral QHS Towana Leita SAILOR, MD   50 mg at 05/25/24 2103   traZODone  (DESYREL ) tablet 50 mg  50 mg Oral QHS PRN White, Patrice L, NP       PTA Medications: Medications Prior to Admission  Medication Sig Dispense Refill Last Dose/Taking   amitriptyline (ELAVIL) 25 MG tablet Take 25 mg by mouth at bedtime.   Taking   ARIPiprazole  (ABILIFY ) 15 MG tablet Take 15 mg by mouth every morning.   Taking   gabapentin (NEURONTIN) 100 MG capsule Take 200 mg by mouth at bedtime.   Taking    Patient Stressors: Occupational concerns   Substance abuse    Patient Strengths: Printmaker for treatment/growth  Physical Health  Supportive family/friends  Work skills   Treatment Modalities: Medication Management, Group therapy, Case management,  1 to 1 session with clinician, Psychoeducation, Recreational therapy.   Physician Treatment Plan for Primary Diagnosis: Cocaine use disorder, severe, dependence (HCC) Long Term Goal(s):     Short Term Goals:    Medication Management: Evaluate patient's response, side effects, and tolerance of  medication regimen.  Therapeutic Interventions: 1 to 1 sessions, Unit Group sessions and Medication administration.  Evaluation of Outcomes: Not Progressing  Physician Treatment Plan for Secondary Diagnosis: Principal Problem:   Cocaine use disorder, severe, dependence (HCC) Active Problems:   Unspecified mood (affective) disorder   MDD (major depressive disorder), severe (HCC)  Long Term Goal(s):     Short Term Goals:       Medication Management: Evaluate patient's response, side effects, and tolerance of medication regimen.  Therapeutic Interventions: 1 to 1 sessions, Unit Group sessions and Medication administration.  Evaluation of Outcomes: Not Progressing   RN Treatment Plan for Primary Diagnosis: Cocaine use disorder, severe, dependence (HCC) Long Term Goal(s): Knowledge of disease and  therapeutic regimen to maintain health will improve  Short Term Goals: Ability to remain free from injury will improve, Ability to verbalize frustration and anger appropriately will improve, Ability to demonstrate self-control, Ability to participate in decision making will improve, Ability to verbalize feelings will improve, Ability to disclose and discuss suicidal ideas, Ability to identify and develop effective coping behaviors will improve, and Compliance with prescribed medications will improve  Medication Management: RN will administer medications as ordered by provider, will assess and evaluate patient's response and provide education to patient for prescribed medication. RN will report any adverse and/or side effects to prescribing provider.  Therapeutic Interventions: 1 on 1 counseling sessions, Psychoeducation, Medication administration, Evaluate responses to treatment, Monitor vital signs and CBGs as ordered, Perform/monitor CIWA, COWS, AIMS and Fall Risk screenings as ordered, Perform wound care treatments as ordered.  Evaluation of Outcomes: Not Progressing   LCSW Treatment Plan for Primary Diagnosis: Cocaine use disorder, severe, dependence (HCC) Long Term Goal(s): Safe transition to appropriate next level of care at discharge, Engage patient in therapeutic group addressing interpersonal concerns.  Short Term Goals: Engage patient in aftercare planning with referrals and resources, Increase social support, Increase ability to appropriately verbalize feelings, Increase emotional regulation, Facilitate acceptance of mental health diagnosis and concerns, Facilitate patient progression through stages of change regarding substance use diagnoses and concerns, Identify triggers associated with mental health/substance abuse issues, and Increase skills for wellness and recovery  Therapeutic Interventions: Assess for all discharge needs, 1 to 1 time with Social worker, Explore available resources  and support systems, Assess for adequacy in community support network, Educate family and significant other(s) on suicide prevention, Complete Psychosocial Assessment, Interpersonal group therapy.  Evaluation of Outcomes: Not Progressing   Progress in Treatment: Attending groups: No. Participating in groups: No. Taking medication as prescribed: Yes. Toleration medication: Yes. Family/Significant other contact made: No, will contact:  Angel Ortiz, mother, [phone] Patient understands diagnosis: Yes. Discussing patient identified problems/goals with staff: Yes. Medical problems stabilized or resolved: Yes. Denies suicidal/homicidal ideation: Yes. Issues/concerns per patient self-inventory: No. None reported.  New problem(s) identified: No, Describe:  None identified.  New Short Term/Long Term Goal(s): detox, medication management for mood stabilization; elimination of SI thoughts; development of comprehensive mental wellness/sobriety plan  Patient Goals: Going home. I'd be open to SAIOP.  Discharge Plan or Barriers: Patient recently admitted. CSW will continue to follow and assess for appropriate referrals and possible discharge planning.    Reason for Continuation of Hospitalization: Depression Medication stabilization Withdrawal symptoms  Estimated Length of Stay: 1-3 days.  Last 3 [place] Suicide Severity Risk Score: Flowsheet Row Ad[facility] (Current) [facility] from 05/23/2024 in [facility]  C-SSRS RISK CATEGORY High  Risk High Risk    Last PHQ 2/9 Scores:     No data to display          Scribe for Treatment Team: Isadore Palecek  Nunez-Uva, ISRAEL 05/26/2024 1:44 PM

## 2024-05-26 NOTE — Group Note (Signed)
 Date:  05/26/2024 Time:  10:12 AM  Group Topic/Focus: Recreational Therapy    Pt did not attend recreational therapy group  Tashari Schoenfelder R Jomarion Mish 05/26/2024, 10:12 AM

## 2024-05-26 NOTE — Group Note (Signed)
 Recreation Therapy Group Note   Group Topic:Team Building  Group Date: 05/26/2024 Start Time: 0932 End Time: 1007 Facilitators: Shamariah Shewmake-McCall, LRT,CTRS Location: [address] Dayroom   Group Topic: Communication, Team Building, Problem Solving  Goal Area(s) Addresses:  Patient will effectively work with peer towards shared goal.  Patient will identify skills used to make activity successful.  Patient will identify how skills used during activity can be used to reach post d/c goals.   Behavioral Response:   Intervention: STEM Activity  Activity: Landing Pad. In teams of 3-5, patients were given 12 plastic drinking straws and an equal length of masking tape. Using the materials provided, patients were asked to build a landing pad to catch a golf ball dropped from approximately 5 feet in the air. All materials were required to be used by the team in their design. LRT facilitated post-activity discussion.  Education: Pharmacist, Community, Scientist, Physiological, Discharge Planning   Education Outcome: Acknowledges education/In group clarification offered/Needs additional education.    Affect/Mood: N/A   Participation Level: Did not attend    Clinical Observations/Individualized Feedback:      Plan: Continue to engage patient in RT group sessions 2-3x/week.   Javeria Briski-McCall, LRT,CTRS 05/26/2024 11:34 AM

## 2024-05-27 NOTE — Progress Notes (Signed)
°   05/27/24 1400  Psych Admission Type (Psych Patients Only)  Admission Status Voluntary  Psychosocial Assessment  Patient Complaints Depression  Eye Contact Fair  Facial Expression Sad  Affect Sad  Speech Logical/coherent  Interaction Assertive  Motor Activity Slow  Appearance/Hygiene Unremarkable  Behavior Characteristics Appropriate to situation  Mood Anxious;Depressed  Thought Process  Coherency WDL  Content WDL  Delusions None reported or observed  Perception WDL  Hallucination None reported or observed  Judgment Poor  Confusion None  Danger to Self  Current suicidal ideation? Denies  Danger to Others  Danger to Others None reported or observed

## 2024-05-27 NOTE — Group Note (Signed)
 Recreation Therapy Group Note   Group Topic:Animal Assisted Therapy   Group Date: 05/27/2024 Start Time: 0945 End Time: 1030 Facilitators: Emory Gallentine-McCall, LRT,CTRS Location: [address] [employer]   AAA/T Program Assumption of Risk Form signed by Patient/ or Parent Legal Guardian Yes  Patient understands his/her participation is voluntary Yes  Behavioral Response:    Education: Charity Fundraiser, Appropriate Animal Interaction   Education Outcome: Acknowledges education.    Clinical Observations/Individualized Feedback: Pet therapy didn't take place due medical situation with handlers older dog.      Plan: Continue to engage patient in RT group sessions 2-3x/week.   Teresia Myint-McCall, LRT,CTRS 05/27/2024 11:44 AM

## 2024-05-27 NOTE — Group Note (Signed)
 LCSW Group Therapy Note   Group Date: 05/27/2024 Start Time: 1100 End Time: 1200   Participation:  patient was present.  She listened and was respectful but didn't participate in the discussion.  Type of Therapy:  Group Therapy  Topic:  Healing Hearts:  A Safe Space for Grief     Objective:   The objective of this class is to create a compassionate environment where participants can process their grief, explore different stages of grief, and discover ways to honor their loved ones through personal rituals.  3 Goals: Provide a safe and supportive space where participants feel comfortable sharing their feelings and experiences of grief without judgment. Educate participants about the stages of grief and emphasize that there is no right way to grieve or a fixed timeline for healing. Introduce the concept of rituals as a means to process grief, allowing individuals to honor their loved ones in a personal and meaningful way.  Summary:  In Healing Hearts: A Safe Space for Grief, we explored the unique and personal journey of grief, emphasizing that everyone experiences it differently.  We discussed the five stages of grief (denial, anger, bargaining, depression, and acceptance), with the understanding that grief is not linear.  Rituals were introduced as a way to help cope with loss, offering comfort and connection through meaningful actions such as lighting candles or taking memory walks. Participants were encouraged to express their emotions, focus on self-care, and reflect on moments of gratitude for their loved ones, recognizing that healing is a process and there is no timeline for grief.  Therapeutic Modalities: Elements of CBT: Challenge thoughts, reframe beliefs, self-compassion Elements of DBT: Mindfulness, distress tolerance, emotion regulation Supportive Therapy:  Provide validation, foster a safe and supportive group environment, normalize grief    Angel Ortiz O Margarete Horace,  LCSWA 05/27/2024  12:18 PM

## 2024-05-27 NOTE — Group Note (Signed)
 Date:  05/27/2024 Time:  11:23 AM  Group Topic/Focus: Holiday Music Holiday music therapy supports adult mental health by reducing stress and anxiety, improving mood, fostering emotional expression, and easing loneliness during a season that can intensify both joy and distress. Through guided listening, gentle singing, lyric discussion, or simple instrument play, adults can process emotions, access positive memories, and experience grounding and connection in a nonverbal, low-pressure way. When used with choice and cultural sensitivity, holiday music therapy is especially helpful for adults coping with depression, anxiety, grief, trauma, or social isolation, making it a valuable, trauma-informed therapeutic approach during the holidays.    Participation Level:  Did Not Attend   Rosaleen BIRCH Shakeia Krus 05/27/2024, 11:23 AM

## 2024-05-27 NOTE — Progress Notes (Signed)
 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 She is admitted following suicide attempt via OD on home medications (unknown amount of gabapentin and seroquel ) as well as crack cocaine.    Case was discussed in the multidisciplinary team. MAR was reviewed and patient was compliant with medications.  She received PRN Hydroxyzine  yesterday.   Psychiatric Team made the following recommendations yesterday: -Increase Abilify  to 20 mg daily for mood stability    On interview today patient reports she slept good last night.  She reports her appetite is doing good.  She reports no SI, HI, or AVH.  She reports no Paranoia or Ideas of Reference.  She reports no issues with her medications.  She reports depressed mood and anxiety around not seeing her children.  Discussed with her the importance of attending groups and she reports that she did attend 2 yesterday.  Encouraged her to attend as many as possible so that she could continue to develop/refine her coping skills.  She reports she is still interested in SA IOP and discussed with her that we would follow-up with social work on this.  She reports no other concerns at present.  Principal Problem: Cocaine use disorder, severe, dependence (HCC) Diagnosis: Principal Problem:   Cocaine use disorder, severe, dependence (HCC) Active Problems:   Unspecified mood (affective) disorder   MDD (major depressive disorder), severe (HCC)  Total Time spent with patient:  I personally spent 35 minutes on the unit in direct patient care. The direct patient care time included face-to-face time with the patient, reviewing the patient's chart, communicating with other professionals, and coordinating care.    Past Psychiatric  History:  Unspecified Mood Disorder and Severe Cocaine Use Disorder, and 1 Prior Psychiatric Hospitalization ([facility] 2009?)  Past Medical History:  Past Medical History:  Diagnosis Date   Addiction [facility])     Past Surgical History:  Procedure Laterality Date   cesarean section     x3   TONSILLECTOMY     Family History: History reviewed. No pertinent family history. Family Psychiatric  History:  None Reported  Social History:  Social History   Substance and Sexual Activity  Alcohol Use Yes   Alcohol/week: 2.0 standard drinks of alcohol   Types: 2 Cans of beer per week   Comment: every other day drinks 2 (24 oz) beets     Social History   Substance and Sexual Activity  Drug Use Yes   Types: Cocaine    Social History   Socioeconomic History   Marital status: Single    Spouse name: Not on file   Number of children: Not on file   Years of education: Not on file   Highest education level: Not on file  Occupational History   Not on file  Tobacco Use   Smoking status: Every Day   Smokeless tobacco: Never  Substance and Sexual Activity   Alcohol use: Yes    Alcohol/week: 2.0 standard drinks of alcohol    Types: 2 Cans of beer per week    Comment: every other day drinks 2 (24 oz) beets   Drug use: Yes    Types: Cocaine   Sexual activity: Yes  Other Topics Concern   Not on file  Social History Narrative  Not on file   Social Drivers of Health   Tobacco Use: High Risk (05/24/2024)   Patient History    Smoking Tobacco Use: Every Day    Smokeless Tobacco Use: Never    Passive Exposure: Not on file  Financial Resource Strain: Not on file  Food Insecurity: No Food Insecurity (05/24/2024)   Epic    Worried About Programme Researcher, Broadcasting/film/video in the Last Year: Never true    Ran Out of Food in the Last Year: Never true  Transportation Needs: No Transportation Needs (05/24/2024)   Epic    Lack of Transportation (Medical): No    Lack of Transportation (Non-Medical): No   Physical Activity: Not on file  Stress: Not on file  Social Connections: Not on file  Depression (EYV7-0): Not on file  Alcohol Screen: Low Risk (05/24/2024)   Alcohol Screen    Last Alcohol Screening Score (AUDIT): 5  Housing: Low Risk (05/24/2024)   Epic    Unable to Pay for Housing in the Last Year: No    Number of Times Moved in the Last Year: 0    Homeless in the Last Year: No  Utilities: Not At Risk (05/24/2024)   Epic    Threatened with loss of utilities: No  Health Literacy: Not on file   Additional Social History:                         Sleep: Good Estimated Sleeping Duration (Last 24 Hours): 6.00-6.75 hours  Appetite:  Good  Current Medications: Current Facility-Administered Medications  Medication Dose Route Frequency Provider Last Rate Last Admin   acetaminophen  (TYLENOL ) tablet 650 mg  650 mg Oral Q6H PRN Butler, Laura N, MD       alum & mag hydroxide-simeth (MAALOX/MYLANTA) 200-200-20 MG/5ML suspension 30 mL  30 mL Oral Q4H PRN Towana Leita SAILOR, MD       ARIPiprazole  (ABILIFY ) tablet 20 mg  20 mg Oral Daily Butler, Laura N, MD   20 mg at 05/27/24 0825   haloperidol  (HALDOL ) tablet 5 mg  5 mg Oral TID PRN Towana Leita SAILOR, MD       And   diphenhydrAMINE  (BENADRYL ) capsule 50 mg  50 mg Oral TID PRN Towana Leita SAILOR, MD       haloperidol  lactate (HALDOL ) injection 10 mg  10 mg Intramuscular TID PRN White, Patrice L, NP       And   diphenhydrAMINE  (BENADRYL ) injection 50 mg  50 mg Intramuscular TID PRN White, Patrice L, NP       And   LORazepam  (ATIVAN ) injection 2 mg  2 mg Intramuscular TID PRN White, Patrice L, NP       haloperidol  lactate (HALDOL ) injection 5 mg  5 mg Intramuscular TID PRN Towana Leita SAILOR, MD       And   diphenhydrAMINE  (BENADRYL ) injection 50 mg  50 mg Intramuscular TID PRN Towana Leita SAILOR, MD       And   LORazepam  (ATIVAN ) injection 2 mg  2 mg Intramuscular TID PRN Towana Leita SAILOR, MD       hydrOXYzine  (ATARAX ) tablet 25 mg  25 mg  Oral TID PRN Butler, Laura N, MD   25 mg at 05/26/24 2109   magnesium  hydroxide (MILK OF MAGNESIA) suspension 30 mL  30 mL Oral Daily PRN Towana Leita SAILOR, MD   30 mL at 05/25/24 0848   nicotine  polacrilex (NICORETTE ) gum 2 mg  2 mg  Oral PRN Butler, Laura N, MD   2 mg at 05/27/24 1251   QUEtiapine  (SEROQUEL ) tablet 50 mg  50 mg Oral QHS Butler, Laura N, MD   50 mg at 05/26/24 2109   traZODone  (DESYREL ) tablet 50 mg  50 mg Oral QHS PRN White, Patrice L, NP        Lab Results: No results found for this or any previous visit (from the past 48 hours).  Blood Alcohol level:  Lab Results  Component Value Date   ETH <15 05/23/2024   ETH 229 (H) 11/13/2017    Metabolic Disorder Labs: Lab Results  Component Value Date   HGBA1C 4.7 (L) 05/25/2024   MPG 88.19 05/25/2024   No results found for: PROLACTIN Lab Results  Component Value Date   CHOL 191 05/25/2024   TRIG 83 05/25/2024   HDL 67 05/25/2024   CHOLHDL 2.9 05/25/2024   VLDL 17 05/25/2024   LDLCALC 108 (H) 05/25/2024    Physical Findings: AIMS:  ,  ,  ,  ,  ,  ,   CIWA:    COWS:     Musculoskeletal: Strength & Muscle Tone: within normal limits Gait & Station: normal Patient leans: N/A  Psychiatric Specialty Exam:  Presentation  General Appearance: Appropriate for Environment; Casual  Eye Contact:Fair  Speech:Clear and Coherent; Normal Rate  Speech Volume:Normal  Handedness:No data recorded  Mood and Affect  Mood:Dysphoric  Affect:Congruent   Thought Process  Thought Processes:Coherent; Goal Directed  Descriptions of Associations:Intact  Orientation:Full (Time, Place and Person)  Thought Content:Logical; WDL  History of Schizophrenia/Schizoaffective disorder:No  Duration of Psychotic Symptoms:N/A  Hallucinations:Hallucinations: None  Ideas of Reference:None  Suicidal Thoughts:Suicidal Thoughts: No  Homicidal Thoughts:Homicidal Thoughts: No   Sensorium  Memory:Immediate Fair; Recent  Fair  Judgment:Intact  Insight:Present   Executive Functions  Concentration:Fair  Attention Span:Fair  Recall:Fair  Fund of Knowledge:Fair  Language:Good   Psychomotor Activity  Psychomotor Activity:Psychomotor Activity: Normal   Assets  Assets:Communication Skills; Desire for Improvement; Physical Health; Resilience   Sleep  Sleep:Sleep: Good    Physical Exam: Physical Exam Vitals and nursing note reviewed.  Constitutional:      General: She is not in acute distress.    Appearance: Normal appearance. She is normal weight. She is not ill-appearing or toxic-appearing.  HENT:     Head: Normocephalic and atraumatic.  Pulmonary:     Effort: Pulmonary effort is normal.  Musculoskeletal:        General: Normal range of motion.  Neurological:     General: No focal deficit present.     Mental Status: She is alert.    Review of Systems  Respiratory:  Negative for cough and shortness of breath.   Cardiovascular:  Negative for chest pain.  Gastrointestinal:  Negative for abdominal pain, constipation, diarrhea, nausea and vomiting.  Neurological:  Negative for dizziness, weakness and headaches.  Psychiatric/Behavioral:  Positive for depression. Negative for hallucinations and suicidal ideas. The patient is nervous/anxious.    Blood pressure 94/61, pulse 90, temperature 98.2 F (36.8 C), temperature source Oral, resp. rate 16, height 5' 3 (1.6 m), weight 60.8 kg, SpO2 100%. Body mass index is 23.74 kg/m.   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  She is admitted following suicide attempt via OD on home medications (unknown amount of gabapentin and seroquel ) as well as crack  cocaine.    Matisse is still isolating some to her room but she is starting to attend some groups and have continued to encourage her to attend more to develop/refine her coping skills.  She did respond well to the motivational interview today.  We continue to pursue SA-IOP and appreciate Social Works assistance with this.  We will not make any changes to her medications at this time.  We will continue to monitor.    Unspecified mood disorder             -substance induced depression vs substance induced bipolar vs MDD vs bipolar -Continue Abilify  20 mg daily for mood stability -Continue Seroquel  50 mg QHS for sleep  -Continue Agitation Protocol: Haldol /Ativan /Benadryl    Substance use concerns  -severe cocaine use:             -motivational interviewing             -recommend bridging directly to rehab   Nicotine  Dependence: -Continue Nicotine  Gum 2 mg PRN   -Continue PRN's: Tylenol , Maalox, Atarax , Milk of Magnesia, Trazodone    Labs 05/24/24 -CMP and CBC grossly WNL (mild left shift, WBC 12) -UDS positive for cocaine -Pregnancy test negative    05/25/24 -A1c 4.7 -Lipid panel with mildly elevated LDL (108) other parameters WNL -TSH WNL   --  The risks/benefits/side-effects/alternatives to medications were discussed in detail with the patient and time was given for questions. The patient consents to medication trials.               -- Encouraged patient to participate in unit milieu and in scheduled group therapies              -- Short Term Goals: Ability to identify changes in lifestyle to reduce recurrence of condition will improve, Ability to verbalize feelings will improve, Ability to disclose and discuss suicidal ideas, Ability to demonstrate self-control will improve, Ability to identify and develop effective coping behaviors will improve, Ability to maintain clinical measurements within normal limits will improve, Compliance with prescribed medications will improve, and  Ability to identify triggers associated with substance abuse/mental health issues will improve             -- Long Term Goals: Improvement in symptoms so as ready for discharge   Safety and Monitoring:             -- Voluntary admission to inpatient psychiatric unit for safety, stabilization and treatment             -- Daily contact with patient to assess and evaluate symptoms and progress in treatment             -- Patient's case to be discussed in multi-disciplinary team meeting             -- Observation Level : q15 minute checks             -- Vital signs:  q12 hours             -- Precautions: suicide, elopement, and assault  Discharge Planning:              -- Social work and case management to assist with discharge planning and identification of hospital follow-up needs prior to discharge             -- Estimated LOS: 2-3 more days             --  Discharge Concerns: Need to establish a safety plan; Medication compliance and effectiveness             -- Discharge Goals: Return home with outpatient referrals for mental health follow-up including medication management/psychotherapy   Angel GORMAN Rosser, DO 05/27/2024, 1:24 PM

## 2024-05-27 NOTE — Group Note (Signed)
 Date:  05/27/2024 Time:  4:49 PM  Group Topic/Focus: Ice Breaker A simple and effective icebreaker for adult group therapy is the One [employer], where each participant shares one word that describes how they are feeling at the start of the session. This activity creates a low-pressure opportunity for self-expression, helps normalize a range of emotions, and encourages emotional awareness without requiring participants to disclose more than they are comfortable sharing. It also allows the facilitator to quickly gauge the groups emotional state and sets a supportive, respectful tone for the session, making it especially suitable for mental health groups involving anxiety, depression, or grief.    Participation Level:  Did Not Attend   Dolores CHRISTELLA Fredericks 05/27/2024, 4:49 PM

## 2024-05-27 NOTE — Progress Notes (Signed)
 Type Note: Client Interaction   This clinical research associate provided Medicaid Managed Care Plans information for patient to be able to switch Medicaid Plan to a different county and be able to receive SAIOP.  SIGNED: Annsleigh Dragoo Nunez-Uva, LCSW-A

## 2024-05-27 NOTE — Group Note (Signed)
 Date:  05/27/2024 Time:  3:47 PM  Group Topic/Focus: Sleep Hygiene Dimensions of Wellness:   The focus of this group is to introduce the topic of wellness and discuss the role each dimension of wellness plays in total health.    Participation Level:  Active  Participation Quality:  Appropriate  Affect:  Appropriate  Cognitive:  Appropriate  Insight: Appropriate  Engagement in Group:  Engaged  Modes of Intervention:  Discussion  Additional Comments:  Pt engaged appropriately during group  Shanda D Adriena Manfre 05/27/2024, 3:47 PM

## 2024-05-27 NOTE — Group Note (Signed)
 Date:  05/27/2024 Time:  10:38 PM  Group Topic/Focus:  Wrap-Up Group:   The focus of this group is to help patients review their daily goal of treatment and discuss progress on daily workbooks.    Participation Level:  Attended   Participation Quality:  Appropriate  Affect:  Appropriate  Cognitive:  Appropriate  Insight: Appropriate  Engagement in Group:  Engaged  Modes of Intervention:  Discussion  Additional Comments:  Patient participated in wrap up group.  Patient day was a [address]  Bari Moats 05/27/2024, 10:38 PM

## 2024-05-27 NOTE — Group Note (Signed)
 Date:  05/27/2024 Time:  4:08 PM  Group Topic/Focus: social work group on grief Social work with grieving adults in mental health settings focuses on supporting individuals as they cope with loss while addressing the emotional, psychological, and social impacts of grief. Social workers assess the nature of the loss, the clients coping abilities, support systems, and any co-occurring mental health concerns such as depression or anxiety. Using a strengths-based and culturally sensitive approach, they provide emotional support, normalize grief reactions, offer psychoeducation, and apply therapeutic interventions to help clients process their feelings and adjust to life changes. The goal is not to eliminate grief, but to promote healthy adaptation, resilience, and improved mental well-being.    Participation Level:  Active   Angel Ortiz [name] 05/27/2024, 4:08 PM

## 2024-05-27 NOTE — Group Note (Signed)
 Date:  05/27/2024 Time:  9:59 AM  Group Topic/Focus: Goals orientation Goals Group:   The focus of this group is to help patients establish daily goals to achieve during treatment and discuss how the patient can incorporate goal setting into their daily lives to aide in recovery. Orientation:   The focus of this group is to educate the patient on the purpose and policies of crisis stabilization and provide a format to answer questions about their admission.  The group details unit policies and expectations of patients while admitted.    Participation Level:  Did Not Attend   Angel Ortiz 05/27/2024, 9:59 AM

## 2024-05-27 NOTE — Plan of Care (Signed)
   Problem: Education: Goal: Emotional status will improve Outcome: Not Progressing Goal: Mental status will improve Outcome: Not Progressing   Problem: Activity: Goal: Interest or engagement in activities will improve Outcome: Not Progressing

## 2024-05-28 NOTE — Group Note (Signed)
 Date:  05/28/2024 Time:  9:59 AM  Group Topic/Focus:  Goals Group:   The focus of this group is to help patients establish daily goals to achieve during treatment and discuss how the patient can incorporate goal setting into their daily lives to aide in recovery. Orientation:   The focus of this group is to educate the patient on the purpose and policies of crisis stabilization and provide a format to answer questions about their admission.  The group details unit policies and expectations of patients while admitted.    Participation Level:  Did Not Attend   Angel Ortiz 05/28/2024, 9:59 AM

## 2024-05-28 NOTE — Group Note (Signed)
 Recreation Therapy Group Note   Group Topic:Communication  Group Date: 05/28/2024 Start Time: 0935 End Time: 0956 Facilitators: Jennylee Uehara-McCall, LRT,CTRS Location: [address] Dayroom   Group Topic: Communication, Team Building, Problem Solving  Goal Area(s) Addresses:  Patient will effectively work with peer towards shared goal.  Patient will identify skills used to make activity successful.  Patient will identify how skills used during activity can be applied to reach post d/c goals.   Behavioral Response:   Intervention: STEM Activity- Glass Blower/designer  Activity: Tallest [employer]. In teams of 5-6, patients were given 11 craft pipe cleaners. Using the materials provided, patients were instructed to compete again the opposing team(s) to build the tallest free-standing structure from floor level. The activity was timed; difficulty increased by clinical research associate as production designer, theatre/television/film continued.  Systematically resources were removed with additional directions for example, placing one arm behind their back, working in silence, and shape stipulations. LRT facilitated post-activity discussion reviewing team processes and necessary communication skills involved in completion. Patients were encouraged to reflect how the skills utilized, or not utilized, in this activity can be incorporated to positively impact support systems post discharge.  Education: Pharmacist, Community, Scientist, Physiological, Discharge Planning   Education Outcome: Acknowledges education/In group clarification offered/Needs additional education.    Affect/Mood: Appropriate   Participation Level: Engaged   Participation Quality: Independent   Behavior: Appropriate   Speech/Thought Process: Focused   Insight: Good   Judgement: Good   Modes of Intervention: STEM Activity   Patient Response to Interventions:  Engaged   Education Outcome:  In group clarification offered    Clinical Observations/Individualized  Feedback: Pt was focused and engaged during group. Pt was interactive and assisted in helping peers put their tower together.      Plan: Continue to engage patient in RT group sessions 2-3x/week.   Angel Ortiz, LRT,CTRS 05/28/2024 12:16 PM

## 2024-05-28 NOTE — Group Note (Signed)
 Date:  05/28/2024 Time:  3:52 PM  Group Topic/Focus: Motivation/Inspiration/Stages of Change Stages of Change:   The focus of this group is to explain the stages of change and help patients identify changes they want to make upon discharge.    Participation Level:  Active  Participation Quality:  Appropriate  Affect:  Appropriate  Cognitive:  Alert  Insight: Appropriate  Engagement in Group:  Engaged  Modes of Intervention:  Discussion  Additional Comments:  Pt engaged appropriately during group.  Clarann Helvey D Annita Ratliff 05/28/2024, 3:52 PM

## 2024-05-28 NOTE — Group Note (Signed)
 Date:  05/28/2024 Time:  12:45 PM  Group Topic/Focus: Pharmacy    Pt did attend pharmacy group  Angel Ortiz 05/28/2024, 12:45 PM

## 2024-05-28 NOTE — Progress Notes (Addendum)
 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 She is admitted following suicide attempt via OD on home medications (unknown amount of gabapentin and seroquel ) as well as crack cocaine.   24-hour chart review: Case was discussed in the multidisciplinary team. MAR was reviewed and patient was compliant with medications.  She received PRN Hydroxyzine  yesterday.   Psychiatric Team made the following recommendations yesterday: -Continue Abilify  to 20 mg daily for mood stability  Today's assessment notes: Patient is seen and examined on the unit sitting up on a chair.  She presents alert, pleasant, cooperative, and oriented to person, time, place, and situation.  She reports mood and anxiety symptoms are minimized and rates both as 0/10 with 10 being high severity.  Patient observed attending and participating in therapeutic milieu and unit group activities and learning some coping skills.  She reports interest in SA IOP at [facility] rather than [facility] as recommended by the child psychotherapist.  Social worker to follow-up on this IOP for patient with [facility].  Denies SI, HI, or AVH.  Denies paranoia, ideas of reference, or delusions.  Reports anxiety is at manageable level.  Appetite and sleep are stable.  Concentration without concern.  Energy level is adequate.  Compliant with psychotropic medication without any side effects.  No EPS or movement disorder observed.  Principal Problem: Cocaine use disorder, severe, dependence (HCC) Diagnosis: Principal Problem:   Cocaine use disorder, severe, dependence (HCC) Active Problems:   Unspecified mood (affective) disorder   MDD (major depressive disorder), severe (HCC)  Total Time spent with patient: 45 minutes Past  Psychiatric History:  Unspecified Mood Disorder and Severe Cocaine Use Disorder, and 1 Prior Psychiatric Hospitalization ([facility] 2009?)  Past Medical History:  Past Medical History:  Diagnosis Date   Addiction [facility])     Past Surgical History:  Procedure Laterality Date   cesarean section     x3   TONSILLECTOMY     Family History: History reviewed. No pertinent family history. Family Psychiatric  History:  None Reported  Social History:  Social History   Substance and Sexual Activity  Alcohol Use Yes   Alcohol/week: 2.0 standard drinks of alcohol   Types: 2 Cans of beer per week   Comment: every other day drinks 2 (24 oz) beets     Social History   Substance and Sexual Activity  Drug Use Yes   Types: Cocaine    Social History   Socioeconomic History   Marital status: Single    Spouse name: Not on file   Number of children: Not on file   Years of education: Not on file   Highest education level: Not on file  Occupational History   Not on file  Tobacco Use   Smoking status: Every Day   Smokeless tobacco: Never  Substance and Sexual Activity   Alcohol use: Yes    Alcohol/week: 2.0 standard drinks of alcohol    Types: 2 Cans of beer per week    Comment: every other day drinks 2 (24 oz) beets   Drug use: Yes    Types: Cocaine   Sexual activity: Yes  Other Topics Concern   Not on file  Social History Narrative   Not on file   Social Drivers of  Health   Tobacco Use: High Risk (05/24/2024)   Patient History    Smoking Tobacco Use: Every Day    Smokeless Tobacco Use: Never    Passive Exposure: Not on file  Financial Resource Strain: Not on file  Food Insecurity: No Food Insecurity (05/24/2024)   Epic    Worried About Programme Researcher, Broadcasting/film/video in the Last Year: Never true    Ran Out of Food in the Last Year: Never true  Transportation Needs: No Transportation Needs (05/24/2024)   Epic    Lack of Transportation (Medical): No    Lack of Transportation  (Non-Medical): No  Physical Activity: Not on file  Stress: Not on file  Social Connections: Not on file  Depression (EYV7-0): Not on file  Alcohol Screen: Low Risk (05/24/2024)   Alcohol Screen    Last Alcohol Screening Score (AUDIT): 5  Housing: Low Risk (05/24/2024)   Epic    Unable to Pay for Housing in the Last Year: No    Number of Times Moved in the Last Year: 0    Homeless in the Last Year: No  Utilities: Not At Risk (05/24/2024)   Epic    Threatened with loss of utilities: No  Health Literacy: Not on file   Additional Social History:    Sleep: Good Estimated Sleeping Duration (Last 24 Hours): 6.25-7.25 hours  Appetite:  Good  Current Medications: Current Facility-Administered Medications  Medication Dose Route Frequency Provider Last Rate Last Admin   acetaminophen  (TYLENOL ) tablet 650 mg  650 mg Oral Q6H PRN Butler, Laura N, MD       alum & mag hydroxide-simeth (MAALOX/MYLANTA) 200-200-20 MG/5ML suspension 30 mL  30 mL Oral Q4H PRN Towana Leita SAILOR, MD       ARIPiprazole  (ABILIFY ) tablet 20 mg  20 mg Oral Daily Towana Leita SAILOR, MD   20 mg at 05/28/24 9272   haloperidol  (HALDOL ) tablet 5 mg  5 mg Oral TID PRN Towana Leita SAILOR, MD       And   diphenhydrAMINE  (BENADRYL ) capsule 50 mg  50 mg Oral TID PRN Towana Leita SAILOR, MD       haloperidol  lactate (HALDOL ) injection 10 mg  10 mg Intramuscular TID PRN White, Patrice L, NP       And   diphenhydrAMINE  (BENADRYL ) injection 50 mg  50 mg Intramuscular TID PRN White, Patrice L, NP       And   LORazepam  (ATIVAN ) injection 2 mg  2 mg Intramuscular TID PRN White, Patrice L, NP       haloperidol  lactate (HALDOL ) injection 5 mg  5 mg Intramuscular TID PRN Towana Leita SAILOR, MD       And   diphenhydrAMINE  (BENADRYL ) injection 50 mg  50 mg Intramuscular TID PRN Towana Leita SAILOR, MD       And   LORazepam  (ATIVAN ) injection 2 mg  2 mg Intramuscular TID PRN Towana Leita SAILOR, MD       hydrOXYzine  (ATARAX ) tablet 25 mg  25 mg Oral TID PRN  Butler, Laura N, MD   25 mg at 05/27/24 2058   magnesium  hydroxide (MILK OF MAGNESIA) suspension 30 mL  30 mL Oral Daily PRN Towana Leita SAILOR, MD   30 mL at 05/25/24 0848   nicotine  polacrilex (NICORETTE ) gum 2 mg  2 mg Oral PRN Butler, Laura N, MD   2 mg at 05/28/24 1607   QUEtiapine  (SEROQUEL ) tablet 50 mg  50 mg Oral QHS Towana Leita SAILOR, MD  50 mg at 05/27/24 2058   traZODone  (DESYREL ) tablet 50 mg  50 mg Oral QHS PRN White, Patrice L, NP       Lab Results: No results found for this or any previous visit (from the past 48 hours).  Blood Alcohol level:  Lab Results  Component Value Date   ETH <15 05/23/2024   ETH 229 (H) 11/13/2017   Metabolic Disorder Labs: Lab Results  Component Value Date   HGBA1C 4.7 (L) 05/25/2024   MPG 88.19 05/25/2024   No results found for: PROLACTIN Lab Results  Component Value Date   CHOL 191 05/25/2024   TRIG 83 05/25/2024   HDL 67 05/25/2024   CHOLHDL 2.9 05/25/2024   VLDL 17 05/25/2024   LDLCALC 108 (H) 05/25/2024   Physical Findings: AIMS:  ,  ,  ,  ,  ,  ,   CIWA:    COWS:     Musculoskeletal: Strength & Muscle Tone: within normal limits Gait & Station: normal Patient leans: N/A  Psychiatric Specialty Exam:  Presentation  General Appearance: Appropriate for Environment; Casual  Eye Contact:Fair  Speech:Clear and Coherent  Speech Volume:Normal  Handedness:Right  Mood and Affect  Mood:Dysphoric  Affect:Congruent  Thought Process  Thought Processes:Coherent  Descriptions of Associations:Intact  Orientation:Full (Time, Place and Person)  Thought Content:Logical  History of Schizophrenia/Schizoaffective disorder:No  Duration of Psychotic Symptoms:N/A  Hallucinations:Hallucinations: None  Ideas of Reference:None  Suicidal Thoughts:Suicidal Thoughts: No  Homicidal Thoughts:Homicidal Thoughts: No  Sensorium  Memory:Immediate Fair; Recent Fair  Judgment:Intact  Insight:Present  Executive Functions   Concentration:Fair  Attention Span:Fair  Recall:Fair  Fund of Knowledge:Fair  Language:Good  Psychomotor Activity  Psychomotor Activity:Psychomotor Activity: Normal  Assets  Assets:Communication Skills; Desire for Improvement; Physical Health; Resilience  Sleep  Sleep:Sleep: Good Number of Hours of Sleep: 7.75  Physical Exam: Physical Exam Vitals and nursing note reviewed.  Constitutional:      General: She is not in acute distress.    Appearance: Normal appearance. She is normal weight. She is not ill-appearing or toxic-appearing.  HENT:     Head: Normocephalic and atraumatic.     Right Ear: External ear normal.     Left Ear: External ear normal.     Mouth/Throat:     Mouth: Mucous membranes are moist.     Pharynx: Oropharynx is clear.  Eyes:     Extraocular Movements: Extraocular movements intact.  Cardiovascular:     Rate and Rhythm: Normal rate.     Pulses: Normal pulses.  Pulmonary:     Effort: Pulmonary effort is normal. No respiratory distress.  Musculoskeletal:        General: Normal range of motion.     Cervical back: Normal range of motion.  Skin:    General: Skin is dry.  Neurological:     General: No focal deficit present.     Mental Status: She is alert and oriented to person, place, and time.  Psychiatric:        Mood and Affect: Mood normal.        Behavior: Behavior normal.    Review of Systems  Constitutional:  Negative for chills and fever.  HENT:  Negative for sore throat.   Eyes:  Negative for blurred vision.  Respiratory:  Negative for cough and shortness of breath.   Cardiovascular:  Negative for chest pain and palpitations.  Gastrointestinal:  Negative for abdominal pain, constipation, diarrhea, heartburn, nausea and vomiting.  Genitourinary:  Negative for dysuria.  Musculoskeletal:  Negative for falls.  Skin:  Negative for itching and rash.  Neurological:  Negative for dizziness, weakness and headaches.  Endo/Heme/Allergies:  Negative.   Psychiatric/Behavioral:  Positive for depression. Negative for hallucinations and suicidal ideas. The patient is nervous/anxious.    Blood pressure 106/72, pulse 99, temperature 98 F (36.7 C), temperature source Oral, resp. rate 16, height 5' 3 (1.6 m), weight 60.8 kg, SpO2 100%. Body mass index is 23.74 kg/m.  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 She is admitted following suicide attempt via OD on home medications (unknown amount of gabapentin and seroquel ) as well as crack cocaine.   Jarielys is attending therapeutic milieu and unit group activities today and interacting with the peers. She did respond well to the motivational interview today.  We continue to pursue SA-IOP at [facility] but patient prefers and appreciate Social Works assistance with this.  We will not make any changes to her medications at this time.  We will continue to monitor.   Unspecified mood disorder             -substance induced depression vs substance induced bipolar vs MDD vs bipolar -Continue Abilify  20 mg daily for mood stability -Continue Seroquel  50 mg QHS for sleep  -Continue Agitation Protocol: Haldol /Ativan /Benadryl   Substance use concerns  -Severe cocaine use:             -motivational interviewing             -recommend bridging directly to rehab  Nicotine  Dependence: -Continue Nicotine  Gum 2 mg PRN  -Continue PRN's: Tylenol , Maalox, Atarax , Milk of Magnesia, Trazodone   Labs 05/24/24 -CMP and CBC grossly WNL (mild left shift, WBC 12) -UDS positive for cocaine -Pregnancy test negative    05/25/24 -A1c 4.7 -Lipid panel with mildly elevated LDL (108) other parameters WNL -TSH WNL  --  The  risks/benefits/side-effects/alternatives to medications were discussed in detail with the patient and time was given for questions. The patient consents to medication trials.               -- Encouraged patient to participate in unit milieu and in scheduled group therapies              -- Short Term Goals: Ability to identify changes in lifestyle to reduce recurrence of condition will improve, Ability to verbalize feelings will improve, Ability to disclose and discuss suicidal ideas, Ability to demonstrate self-control will improve, Ability to identify and develop effective coping behaviors will improve, Ability to maintain clinical measurements within normal limits will improve, Compliance with prescribed medications will improve, and Ability to identify triggers associated with substance abuse/mental health issues will improve             -- Long Term Goals: Improvement in symptoms so as ready for discharge  Safety and Monitoring:             -- Voluntary admission to inpatient psychiatric unit for safety, stabilization and treatment             -- Daily contact with patient to assess and evaluate symptoms and progress in treatment             -- Patient's case to be discussed in multi-disciplinary team meeting             --  Observation Level : q15 minute checks             -- Vital signs:  q12 hours             -- Precautions: suicide, elopement, and assault  Discharge Planning:              -- Social work and case management to assist with discharge planning and identification of hospital follow-up needs prior to discharge             -- Estimated LOS: 2-3 more days             -- Discharge Concerns: Need to establish a safety plan; Medication compliance and effectiveness             -- Discharge Goals: Return home with outpatient referrals for mental health follow-up including medication management/psychotherapy  Angel JAYSON Azure, FNP 05/28/2024, 4:27 PM Patient ID: Angel Ortiz, female    DOB: 1986-03-22, 38 y.o.   MRN: [id number]

## 2024-05-28 NOTE — [facility] Suicide Risk Assessment (Signed)
 [facility] INPATIENT:  Family/Significant Other Suicide Prevention Education  Suicide Prevention Education:  Education Completed; Eiko Mcgowen, mother, [phone], has been identified by the patient as the family member/significant other with whom the patient will be residing, and identified as the person(s) who will aid the patient in the event of a mental health crisis (suicidal ideations/suicide attempt).  With written consent from the patient, the family member/significant other has been provided the following suicide prevention education, prior to the and/or following the discharge of the patient.  The suicide prevention education provided includes the following: Suicide risk factors Suicide prevention and interventions National Suicide Hotline telephone number [facility] assessment telephone number [facility] Emergency Assistance 911 [facility] and/or Residential Mobile Crisis Unit telephone number  Request made of family/significant other to: Remove weapons (e.g., guns, rifles, knives), all items previously/currently identified as safety concern.   Remove drugs/medications (over-the-counter, prescriptions, illicit drugs), all items previously/currently identified as a safety concern.  The family member/significant other verbalizes understanding of the suicide prevention education information provided.  The family member/significant other agrees to remove the items of safety concern listed above.  Roselyn GORMAN Lento 05/28/2024, 12:11 PM

## 2024-05-28 NOTE — Progress Notes (Signed)
°   05/28/24 0829  Psych Admission Type (Psych Patients Only)  Admission Status Voluntary  Psychosocial Assessment  Patient Complaints None  Eye Contact Fair  Facial Expression Flat  Affect Sad  Speech Logical/coherent  Interaction Assertive  Motor Activity Slow  Appearance/Hygiene Unremarkable  Behavior Characteristics Appropriate to situation  Mood Pleasant  Thought Process  Coherency WDL  Content WDL  Delusions None reported or observed  Perception WDL  Hallucination None reported or observed  Judgment Impaired  Confusion None  Danger to Self  Current suicidal ideation? Denies  Agreement Not to Harm Self Yes  Description of Agreement Verbal  Danger to Others  Danger to Others None reported or observed

## 2024-05-28 NOTE — Group Note (Signed)
 Date:  05/28/2024 Time:  3:20 PM  Group Topic/Focus: Spiritual Wellness with Chaplain    Pt did not attend spiritual wellness group with the chaplain  Bedie Dominey R Revecca Nachtigal 05/28/2024, 3:20 PM

## 2024-05-28 NOTE — Group Note (Signed)
 Date:  05/28/2024 Time:  10:12 AM  Group Topic/Focus: Recreational Therapy    Pt did attend recreational therapy group  Angel Ortiz 05/28/2024, 10:12 AM

## 2024-05-28 NOTE — [facility] Group Notes (Signed)
 [facility] Group Notes:  (Nursing/MHT/Case Management/Adjunct)  Date:  05/28/2024  Time:  9:30 PM  Type of Therapy:  NA group  Participation Level:  Active  Participation Quality:  Appropriate  Affect:  Appropriate  Cognitive:  Appropriate  Insight:  Appropriate  Engagement in Group:  Engaged  Modes of Intervention:  Education  Summary of Progress/Problems:Attended NA group.  Grayce LITTIE Essex 05/28/2024, 9:30 PM

## 2024-05-28 NOTE — Plan of Care (Signed)
   Problem: Education: Goal: Knowledge of [facility] Education information/materials will improve Outcome: Progressing Goal: Emotional status will improve Outcome: Progressing Goal: Mental status will improve Outcome: Progressing Goal: Verbalization of understanding the information provided will improve Outcome: Progressing

## 2024-05-29 NOTE — Plan of Care (Signed)
   Problem: Education: Goal: Emotional status will improve Outcome: Progressing Goal: Mental status will improve Outcome: Progressing Goal: Verbalization of understanding the information provided will improve Outcome: Progressing   Problem: Activity: Goal: Interest or engagement in activities will improve Outcome: Progressing

## 2024-05-29 NOTE — Plan of Care (Signed)

## 2024-05-29 NOTE — Progress Notes (Signed)
(  Sleep Hours) - 7 (Any PRNs that were needed, meds refused, or side effects to meds)- PRN vistaril 25 mg given at pt request, no meds refused.  (Any disturbances and when (visitation, over night)- None  (Concerns raised by the patient)- None  (SI/HI/AVH)- Denies SI/HI/AVH

## 2024-05-29 NOTE — [facility] Group Notes (Signed)
 Pt did not attend the CSW group today, 05/29/24 ([id number])

## 2024-05-29 NOTE — [facility] Group Notes (Signed)
 [facility] Group Notes:  (Nursing/MHT/Case Management/Adjunct)  Date:  05/29/2024  Time:  2000  Type of Therapy:  Wrap up group  Participation Level:  Active  Participation Quality:  Appropriate, Attentive, Sharing, and Supportive  Affect:  Depressed  Cognitive:  Alert  Insight:  Improving  Engagement in Group:  Engaged  Modes of Intervention:  Clarification, Education, and Support  Summary of Progress/Problems: Positive thinking and positive change were discussed.   Lenora Shaver S 05/29/2024, 9:10 PM

## 2024-05-29 NOTE — Progress Notes (Signed)
 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 She is admitted following suicide attempt via OD on home medications (unknown amount of gabapentin and seroquel ) as well as crack cocaine.   24-hour chart review: Case was discussed in the multidisciplinary team. MAR was reviewed and patient was compliant with medications.  She received PRN Hydroxyzine  and nicotine  gum x 2.   Psychiatric Team made the following recommendations yesterday: -Continue Abilify  to 20 mg daily for mood stability  Today's assessment notes: She presents alert, pleasant, cooperative, and oriented to person, time, place, and situation. Patient is seen and examined on the unit sitting up on a chair. She reports mood and anxiety symptoms are resolving and better. Rates both as 0/10 with 10 being high severity.  Patient observed attending and participating in therapeutic milieu and unit group activities and learning some coping skills.  She continues to reports interest in SA IOP at [facility] rather than [facility] as recommended by the child psychotherapist.  Social worker to follow-up on this IOP for patient with [facility].  Denies SI, HI, or AVH.  Denies paranoia, ideas of reference, or delusions.  Reports anxiety is at manageable level.  Appetite and sleep are stable.  Concentration without concern.  Energy level is adequate.  Compliant with psychotropic medication without any side effects.  No EPS or movement disorder observed. Report missing her children and plans to follow-up with IOP and Narcotic Anonymous upon discharge.  Principal Problem: Cocaine use disorder, severe, dependence (HCC) Diagnosis: Principal Problem:   Cocaine use disorder, severe, dependence (HCC) Active Problems:   Unspecified mood  (affective) disorder   MDD (major depressive disorder), severe (HCC)  Total Time spent with patient: 45 minutes Past Psychiatric History:  Unspecified Mood Disorder and Severe Cocaine Use Disorder, and 1 Prior Psychiatric Hospitalization ([facility] 2009?)  Past Medical History:  Past Medical History:  Diagnosis Date   Addiction [facility])     Past Surgical History:  Procedure Laterality Date   cesarean section     x3   TONSILLECTOMY     Family History: History reviewed. No pertinent family history. Family Psychiatric  History:  None Reported  Social History:  Social History   Substance and Sexual Activity  Alcohol Use Yes   Alcohol/week: 2.0 standard drinks of alcohol   Types: 2 Cans of beer per week   Comment: every other day drinks 2 (24 oz) beets     Social History   Substance and Sexual Activity  Drug Use Yes   Types: Cocaine    Social History   Socioeconomic History   Marital status: Single    Spouse name: Not on file   Number of children: Not on file   Years of education: Not on file   Highest education level: Not on file  Occupational History   Not on file  Tobacco Use   Smoking status: Every Day   Smokeless tobacco: Never  Substance and Sexual Activity   Alcohol use: Yes    Alcohol/week: 2.0 standard drinks of alcohol    Types: 2 Cans of beer per week    Comment: every other day drinks 2 (24 oz) beets   Drug use: Yes    Types: Cocaine   Sexual activity: Yes  Other Topics  Concern   Not on file  Social History Narrative   Not on file   Social Drivers of Health   Tobacco Use: High Risk (05/24/2024)   Patient History    Smoking Tobacco Use: Every Day    Smokeless Tobacco Use: Never    Passive Exposure: Not on file  Financial Resource Strain: Not on file  Food Insecurity: No Food Insecurity (05/24/2024)   Epic    Worried About Programme Researcher, Broadcasting/film/video in the Last Year: Never true    Ran Out of Food in the Last Year: Never true  Transportation Needs: No  Transportation Needs (05/24/2024)   Epic    Lack of Transportation (Medical): No    Lack of Transportation (Non-Medical): No  Physical Activity: Not on file  Stress: Not on file  Social Connections: Not on file  Depression (EYV7-0): Not on file  Alcohol Screen: Low Risk (05/24/2024)   Alcohol Screen    Last Alcohol Screening Score (AUDIT): 5  Housing: Low Risk (05/24/2024)   Epic    Unable to Pay for Housing in the Last Year: No    Number of Times Moved in the Last Year: 0    Homeless in the Last Year: No  Utilities: Not At Risk (05/24/2024)   Epic    Threatened with loss of utilities: No  Health Literacy: Not on file   Additional Social History:    Sleep: Good Estimated Sleeping Duration (Last 24 Hours): 5.75-6.50 hours  Appetite:  Good  Current Medications: Current Facility-Administered Medications  Medication Dose Route Frequency Provider Last Rate Last Admin   acetaminophen  (TYLENOL ) tablet 650 mg  650 mg Oral Q6H PRN Butler, Laura N, MD       alum & mag hydroxide-simeth (MAALOX/MYLANTA) 200-200-20 MG/5ML suspension 30 mL  30 mL Oral Q4H PRN Towana Leita SAILOR, MD       ARIPiprazole  (ABILIFY ) tablet 20 mg  20 mg Oral Daily Towana Leita SAILOR, MD   20 mg at 05/29/24 9161   haloperidol  (HALDOL ) tablet 5 mg  5 mg Oral TID PRN Towana Leita SAILOR, MD       And   diphenhydrAMINE  (BENADRYL ) capsule 50 mg  50 mg Oral TID PRN Towana Leita SAILOR, MD       haloperidol  lactate (HALDOL ) injection 10 mg  10 mg Intramuscular TID PRN White, Patrice L, NP       And   diphenhydrAMINE  (BENADRYL ) injection 50 mg  50 mg Intramuscular TID PRN White, Patrice L, NP       And   LORazepam  (ATIVAN ) injection 2 mg  2 mg Intramuscular TID PRN White, Patrice L, NP       haloperidol  lactate (HALDOL ) injection 5 mg  5 mg Intramuscular TID PRN Towana Leita SAILOR, MD       And   diphenhydrAMINE  (BENADRYL ) injection 50 mg  50 mg Intramuscular TID PRN Towana Leita SAILOR, MD       And   LORazepam  (ATIVAN ) injection 2 mg   2 mg Intramuscular TID PRN Towana Leita SAILOR, MD       hydrOXYzine  (ATARAX ) tablet 25 mg  25 mg Oral TID PRN Butler, Laura N, MD   25 mg at 05/28/24 2117   magnesium  hydroxide (MILK OF MAGNESIA) suspension 30 mL  30 mL Oral Daily PRN Towana Leita SAILOR, MD   30 mL at 05/25/24 0848   nicotine  polacrilex (NICORETTE ) gum 2 mg  2 mg Oral PRN Towana Leita SAILOR, MD   2  mg at 05/29/24 1350   QUEtiapine  (SEROQUEL ) tablet 50 mg  50 mg Oral QHS Butler, Laura N, MD   50 mg at 05/28/24 2117   traZODone  (DESYREL ) tablet 50 mg  50 mg Oral QHS PRN White, Patrice L, NP       Lab Results: No results found for this or any previous visit (from the past 48 hours).  Blood Alcohol level:  Lab Results  Component Value Date   ETH <15 05/23/2024   ETH 229 (H) 11/13/2017   Metabolic Disorder Labs: Lab Results  Component Value Date   HGBA1C 4.7 (L) 05/25/2024   MPG 88.19 05/25/2024   No results found for: PROLACTIN Lab Results  Component Value Date   CHOL 191 05/25/2024   TRIG 83 05/25/2024   HDL 67 05/25/2024   CHOLHDL 2.9 05/25/2024   VLDL 17 05/25/2024   LDLCALC 108 (H) 05/25/2024   Physical Findings: AIMS:  ,  ,  ,  ,  ,  ,   CIWA:    COWS:     Musculoskeletal: Strength & Muscle Tone: within normal limits Gait & Station: normal Patient leans: N/A  Psychiatric Specialty Exam:  Presentation  General Appearance: Appropriate for Environment; Casual  Eye Contact:Good  Speech:Clear and Coherent  Speech Volume:Normal  Handedness:Right  Mood and Affect  Mood:Euthymic  Affect:Appropriate; Congruent  Thought Process  Thought Processes:Coherent; Goal Directed  Descriptions of Associations:Intact  Orientation:Full (Time, Place and Person)  Thought Content:Logical  History of Schizophrenia/Schizoaffective disorder:No  Duration of Psychotic Symptoms:N/A  Hallucinations:Hallucinations: None  Ideas of Reference:None  Suicidal Thoughts:Suicidal Thoughts: No  Homicidal  Thoughts:Homicidal Thoughts: No  Sensorium  Memory:Immediate Fair  Judgment:Fair  Insight:Fair  Executive Functions  Concentration:Fair  Attention Span:Fair  Recall:Fair  Fund of Knowledge:Fair  Language:Good  Psychomotor Activity  Psychomotor Activity:Psychomotor Activity: Normal  Assets  Assets:Communication Skills; Physical Health; Resilience  Sleep  Sleep:Sleep: Good Number of Hours of Sleep: 7  Physical Exam: Physical Exam Vitals and nursing note reviewed.  Constitutional:      General: She is not in acute distress.    Appearance: Normal appearance. She is normal weight. She is not ill-appearing or toxic-appearing.  HENT:     Head: Normocephalic and atraumatic.     Right Ear: External ear normal.     Left Ear: External ear normal.     Mouth/Throat:     Mouth: Mucous membranes are moist.     Pharynx: Oropharynx is clear.  Eyes:     Extraocular Movements: Extraocular movements intact.  Cardiovascular:     Rate and Rhythm: Normal rate.     Pulses: Normal pulses.  Pulmonary:     Effort: Pulmonary effort is normal. No respiratory distress.  Musculoskeletal:        General: Normal range of motion.     Cervical back: Normal range of motion.  Skin:    General: Skin is dry.  Neurological:     General: No focal deficit present.     Mental Status: She is alert and oriented to person, place, and time.  Psychiatric:        Mood and Affect: Mood normal.        Behavior: Behavior normal.    Review of Systems  Constitutional:  Negative for chills and fever.  HENT:  Negative for sore throat.   Eyes:  Negative for blurred vision.  Respiratory:  Negative for cough and shortness of breath.   Cardiovascular:  Negative for chest pain and palpitations.  Gastrointestinal:  Negative for abdominal pain, constipation, diarrhea, heartburn, nausea and vomiting.  Genitourinary:  Negative for dysuria.  Musculoskeletal:  Negative for falls.  Skin:  Negative for itching  and rash.  Neurological:  Negative for dizziness, weakness and headaches.  Endo/Heme/Allergies: Negative.   Psychiatric/Behavioral:  Positive for depression. Negative for hallucinations and suicidal ideas. The patient is nervous/anxious.    Blood pressure 96/61, pulse 88, temperature 98.1 F (36.7 C), temperature source Oral, resp. rate 16, height 5' 3 (1.6 m), weight 60.8 kg, SpO2 100%. Body mass index is 23.74 kg/m.  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 She is admitted following suicide attempt via OD on home medications (unknown amount of gabapentin and seroquel ) as well as crack cocaine.   Amry is attending therapeutic milieu and unit group activities today and interacting with the peers. She did respond well to the motivational interview today.  We continue to pursue SA-IOP at [facility] but patient prefers and appreciate Social Works assistance with this.  We will not make any changes to her medications at this time.  We will continue to monitor.   Unspecified mood disorder             -substance induced depression vs substance induced bipolar vs MDD vs bipolar -Continue Abilify  20 mg daily for mood stability -Continue Seroquel  50 mg QHS for sleep  -Continue Agitation Protocol: Haldol /Ativan /Benadryl   Substance use concerns  -Severe cocaine use:             -motivational interviewing             -recommend bridging directly to rehab  Nicotine  Dependence: -Continue Nicotine  Gum 2 mg PRN  -Continue PRN's: Tylenol , Maalox, Atarax , Milk of Magnesia, Trazodone   Labs 05/24/24 -CMP and CBC grossly WNL (mild left shift, WBC 12) -UDS positive for cocaine -Pregnancy test negative     05/25/24 -A1c 4.7 -Lipid panel with mildly elevated LDL (108) other parameters WNL -TSH WNL  --  The risks/benefits/side-effects/alternatives to medications were discussed in detail with the patient and time was given for questions. The patient consents to medication trials.               -- Encouraged patient to participate in unit milieu and in scheduled group therapies              -- Short Term Goals: Ability to identify changes in lifestyle to reduce recurrence of condition will improve, Ability to verbalize feelings will improve, Ability to disclose and discuss suicidal ideas, Ability to demonstrate self-control will improve, Ability to identify and develop effective coping behaviors will improve, Ability to maintain clinical measurements within normal limits will improve, Compliance with prescribed medications will improve, and Ability to identify triggers associated with substance abuse/mental health issues will improve             -- Long Term Goals: Improvement in symptoms so as ready for discharge  Safety and Monitoring:             -- Voluntary admission to inpatient psychiatric unit for safety, stabilization and treatment             -- Daily contact with patient to assess and evaluate symptoms and progress in treatment             --  Patient's case to be discussed in multi-disciplinary team meeting             -- Observation Level : q15 minute checks             -- Vital signs:  q12 hours             -- Precautions: suicide, elopement, and assault  Discharge Planning:              -- Social work and case management to assist with discharge planning and identification of hospital follow-up needs prior to discharge             -- Estimated LOS: 2-3 more days             -- Discharge Concerns: Need to establish a safety plan; Medication compliance and effectiveness             -- Discharge Goals: Return home with outpatient referrals for mental health follow-up including  medication management/psychotherapy  Angel JAYSON Azure, FNP 05/29/2024, 1:56 PM Patient ID: Angel Ortiz, female   DOB: 07/28/1985, 39 y.o.   MRN: [id number] Patient ID: Angel Ortiz, female   DOB: 03/06/1986, 38 y.o.   MRN: [id number]

## 2024-05-29 NOTE — Progress Notes (Signed)
°   05/28/24 2117  Psych Admission Type (Psych Patients Only)  Admission Status Voluntary  Psychosocial Assessment  Patient Complaints None  Eye Contact Fair  Facial Expression Flat  Affect Sad  Speech Logical/coherent  Interaction Assertive  Motor Activity Slow  Appearance/Hygiene Unremarkable  Behavior Characteristics Cooperative;Calm  Mood Pleasant  Thought Process  Coherency WDL  Content WDL  Delusions None reported or observed  Perception WDL  Hallucination None reported or observed  Judgment Impaired  Confusion None  Danger to Self  Current suicidal ideation? Denies  Agreement Not to Harm Self Yes  Description of Agreement Verbal  Danger to Others  Danger to Others None reported or observed

## 2024-05-29 NOTE — Group Note (Signed)
 Date:  05/29/2024 Time:  9:55 AM  Group Topic/Focus: Goals group  Patients were given two worksheets: a goals worksheet and a list of 50 positive traits. Patients participated in an icebreaker by sharing their name, a desired Christmas gift, and identifying positive traits they align with. They were encouraged to share their responses and goals with the group. The group focused on promoting expanded thinking, social interaction, and positivity.    Participation Level:  Did Not Attend  Participation Quality:  N/A  Affect:  N/A  Cognitive:  N/A  Insight: None  Engagement in Group:  None  Modes of Intervention:  N/A  Additional Comments:  Pt did not attend goals group.  Kristi HERO Rayley Gao 05/29/2024, 9:55 AM

## 2024-05-29 NOTE — [facility] Group Notes (Signed)
 Pt did not attend the nutrition group today, 05/29/24 ([id number])

## 2024-05-29 NOTE — Group Note (Signed)
 LCSW Group Therapy Note   Group Date: 05/29/2024 Start Time: 1100 End Time: 1200   Participation:  did not attend  Type of Therapy:  Group Therapy  Topic:  Speaking from the Heart: Communicating with Understanding and Empathy  Objective:  To help participants develop effective communication skills to express themselves clearly, listen actively, and navigate conflicts in a healthy way.  Goals: Increase awareness of verbal and non-verbal communication skills. Practice using I statements and active listening techniques. Learn coping strategies for managing communication stress.  Summary:  Participants explored the importance of communication, discussed challenges, and practiced skills such as active listening and assertive expression. They reflected on past experiences and identified ways to improve communication in their daily lives.  Therapeutic Modalities: -  Cognitive-Behavioral Therapy (CBT): Restructuring negative thought patterns in communication. -  Mindfulness: Staying present and calm during conversations. -  Psychoeducation: Learning about effective communication techniques.   Tamirra Sienkiewicz O Brydon Spahr, LCSWA 05/29/2024  12:29 PM

## 2024-05-29 NOTE — Group Note (Signed)
 Date:  05/29/2024 Time:  3:07 PM  Group Topic/Focus: Music therapy  Patients participated in [place], selecting songs of their choice within appropriate therapeutic boundaries. The activity promoted bonding and social connection among participants.    Participation Level:  Minimal  Participation Quality:  Appropriate, Attentive, and Resistant  Affect:  Blunted and Flat  Cognitive:  Alert and Appropriate  Insight: Improving and Lacking  Engagement in Group:  Improving, Lacking, Resistant, and Supportive  Modes of Intervention:  Activity, Socialization, and Support  Additional Comments:  Pt appeared resistant and declined to sing karaoke. However, at times, she was observed quietly singing along with her peers. She showed support by maintaining focus and clapping for them.  Kristi HERO Valentine Barney 05/29/2024, 3:07 PM

## 2024-05-29 NOTE — Progress Notes (Signed)
 D: Patient is alert, oriented, pleasant, and cooperative. Denies SI, HI, AVH, and verbally contracts for safety. Patient reports she slept good last night with sleeping medication. Patient reports her appetite as good, energy level as normal, and concentration as good. Patient rates her depression 0/10, hopelessness 0/10, and anxiety 0/10. Patient denies physical symptoms/pain.    A: Scheduled medications administered per MD order. PRN nicotine  gum administered. Support provided. Patient educated on safety on the unit and medications. Routine safety checks every 15 minutes. Patient stated understanding to tell nurse about any new physical symptoms. Patient understands to tell staff of any needs.     R: No adverse drug reactions noted. Patient remains safe at this time and will continue to monitor.    05/29/24 1300  Psych Admission Type (Psych Patients Only)  Admission Status Voluntary  Psychosocial Assessment  Patient Complaints None  Eye Contact Fair  Facial Expression Flat  Affect Sad  Speech Logical/coherent  Interaction Assertive  Motor Activity Other (Comment) (WNL)  Appearance/Hygiene Unremarkable  Behavior Characteristics Cooperative;Calm;Appropriate to situation  Mood Pleasant  Thought Process  Coherency WDL  Content WDL  Delusions None reported or observed  Perception WDL  Hallucination None reported or observed  Judgment Impaired  Confusion None  Danger to Self  Current suicidal ideation? Denies  Agreement Not to Harm Self Yes  Description of Agreement verbal  Danger to Others  Danger to Others None reported or observed

## 2024-05-29 NOTE — Group Note (Signed)
 Occupational Therapy Group Note  Group Topic:Coping Skills  Group Date: 05/29/2024 Start Time: 1500 End Time: 1530 Facilitators: Dot [place] MATSU, OT   Group Description: Group encouraged increased engagement and participation through discussion and activity focused on Coping Ahead. Patients were split up into teams and selected a card from a stack of positive coping strategies. Patients were instructed to act out/charade the coping skill for other peers to guess and receive points for their team. Discussion followed with a focus on identifying additional positive coping strategies and patients shared how they were going to cope ahead over the weekend while continuing hospitalization stay.  Therapeutic Goal(s): Identify positive vs negative coping strategies. Identify coping skills to be used during hospitalization vs coping skills outside of hospital/at home Increase participation in therapeutic group environment and promote engagement in treatment   Participation Level: Engaged   Participation Quality: Independent   Behavior: Appropriate   Speech/Thought Process: Relevant   Affect/Mood: Euthymic   Insight: Fair   Judgement: Fair      Modes of Intervention: Education  Patient Response to Interventions:  Attentive   Plan: Continue to engage patient in OT groups 2 - 3x/week.  05/29/2024  [place] MATSU Dot, OT  Tristine Langi, OT

## 2024-05-29 NOTE — Plan of Care (Signed)
   Problem: Education: Goal: Knowledge of [facility] Education information/materials will improve Outcome: Progressing Goal: Emotional status will improve Outcome: Progressing Goal: Mental status will improve Outcome: Progressing Goal: Verbalization of understanding the information provided will improve Outcome: Progressing

## 2024-05-29 NOTE — [facility] Group Notes (Signed)
 Pt did not attend the OT group today, 05/29/24 ([id number])

## 2024-05-30 ENCOUNTER — Encounter (HOSPITAL_COMMUNITY): Payer: Self-pay

## 2024-05-30 MED ORDER — SENNOSIDES-DOCUSATE SODIUM 8.6-50 MG PO TABS
1.0000 | ORAL_TABLET | Freq: Once | ORAL | Status: AC
  Administered 2024-05-30: 1 via ORAL
  Filled 2024-05-30: qty 1

## 2024-05-30 NOTE — Progress Notes (Signed)
(  Sleep Hours) -8.5 as of 0530 (Any PRNs that were needed, meds refused, or side effects to meds)- prn hydroxyzine  @ 2134 (Any disturbances and when (visitation, over night)-none (Concerns raised by the patient)- none (SI/HI/AVH)- denies all

## 2024-05-30 NOTE — Group Note (Signed)
 Date:  05/30/2024 Time:  12:29 PM  Group Topic/Focus:  Group Topic/Focus:  Description: The Social Wellness group focused on identifying and creating healthy personal boundaries. Patients were educated on the purpose of boundaries, different types of boundaries (emotional, physical, verbal), and the importance of boundaries in maintaining healthy relationships and personal well-being. The group discussed examples of healthy versus unhealthy boundaries and practiced assertive communication through discussion and scenario-based examples. Patients were encouraged to reflect on their own boundary challenges and ways to set limits respectfully and safely.  Patient Participation/Response: Patients were attentive and engaged throughout the group. Several patients actively participated by sharing personal examples, writing on the white bored and asking questions. Overall participation was appropriate, and patients demonstrated understanding of the topic through discussion and responses.    Participation Level:  Active  Participation Quality:  Appropriate  Affect:  Appropriate  Cognitive:  Appropriate  Insight: Appropriate  Engagement in Group:  Engaged  Modes of Intervention:  Activity  Additional Comments:  n/a  Angel Ortiz 05/30/2024, 12:29 PM

## 2024-05-30 NOTE — Progress Notes (Signed)
 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 She is admitted following suicide attempt via OD on home medications (unknown amount of gabapentin and seroquel ) as well as crack cocaine.   24-hour chart review: Case was discussed in the multidisciplinary team. MAR was reviewed and patient was compliant with medications.  She received PRN Hydroxyzine  and nicotine  gum x 2.   Psychiatric Team made the following recommendations yesterday: -Continue Abilify  to 20 mg daily for mood stability  Today's assessment notes: On assessment today, the pt reports that their mood is euthymic, improved since admission, and stable. Denies feeling down, depressed, or sad. Rates both depression and anxiety symptoms as 0/10 with 10 being high severity. She presents alert, pleasant, cooperative, and oriented to person, time, place, and situation. Patient is seen and examined on the unit sitting up on a chair.  Patient is observed attending and participating in therapeutic milieu and unit group activities and learning some coping skills.  She continues to reports interest in SA IOP at [facility].  Reports having an appointment with a psychiatrist in [facility] on Monday, 06/02/2024 for Narcotic Anonymous and IOP.  Compliant with psychotropic medication without any side effects. No EPS or movement disorder observed. Denies paranoia, ideas of reference, or delusions.   Compliant with psychotropic medication without any side effects.  No EPS or movement disorder observed.  If patient's symptoms continue to improve, estimated date of discharge is 05/31/2024. Reports that anxiety symptoms are at manageable level.  Sleep is stable. Appetite is stable.  Concentration is without complaint.  Energy  level is adequate. Denies having any suicidal thoughts. Denies having any suicidal intent and plan.  Denies having any HI.  Denies having psychotic symptoms.   Denies having side effects to current psychiatric medications.   Principal Problem: Cocaine use disorder, severe, dependence (HCC) Diagnosis: Principal Problem:   Cocaine use disorder, severe, dependence (HCC) Active Problems:   Unspecified mood (affective) disorder   MDD (major depressive disorder), severe (HCC)  Total Time spent with patient: 45 minutes Past Psychiatric History:  Unspecified Mood Disorder and Severe Cocaine Use Disorder, and 1 Prior Psychiatric Hospitalization ([facility] 2009?)  Past Medical History:  Past Medical History:  Diagnosis Date   Addiction [facility])     Past Surgical History:  Procedure Laterality Date   cesarean section     x3   TONSILLECTOMY     Family History: History reviewed. No pertinent family history. Family Psychiatric  History:  None Reported  Social History:  Social History   Substance and Sexual Activity  Alcohol Use Yes   Alcohol/week: 2.0 standard drinks of alcohol   Types: 2 Cans of beer per week   Comment: every other day drinks 2 (24 oz) beets     Social History   Substance and Sexual Activity  Drug Use Yes   Types: Cocaine    Social History   Socioeconomic History   Marital status: Single    Spouse name: Not on file   Number of children: Not on file   Years of education: Not on file   Highest education level: Not on file  Occupational History   Not on file  Tobacco Use   Smoking status: Every Day   Smokeless tobacco: Never  Substance and  Sexual Activity   Alcohol use: Yes    Alcohol/week: 2.0 standard drinks of alcohol    Types: 2 Cans of beer per week    Comment: every other day drinks 2 (24 oz) beets   Drug use: Yes    Types: Cocaine   Sexual activity: Yes  Other Topics Concern   Not on file  Social History Narrative   Not on file   Social  Drivers of Health   Tobacco Use: High Risk (05/24/2024)   Patient History    Smoking Tobacco Use: Every Day    Smokeless Tobacco Use: Never    Passive Exposure: Not on file  Financial Resource Strain: Not on file  Food Insecurity: No Food Insecurity (05/24/2024)   Epic    Worried About Programme Researcher, Broadcasting/film/video in the Last Year: Never true    Ran Out of Food in the Last Year: Never true  Transportation Needs: No Transportation Needs (05/24/2024)   Epic    Lack of Transportation (Medical): No    Lack of Transportation (Non-Medical): No  Physical Activity: Not on file  Stress: Not on file  Social Connections: Not on file  Depression (EYV7-0): Not on file  Alcohol Screen: Low Risk (05/24/2024)   Alcohol Screen    Last Alcohol Screening Score (AUDIT): 5  Housing: Low Risk (05/24/2024)   Epic    Unable to Pay for Housing in the Last Year: No    Number of Times Moved in the Last Year: 0    Homeless in the Last Year: No  Utilities: Not At Risk (05/24/2024)   Epic    Threatened with loss of utilities: No  Health Literacy: Not on file   Additional Social History:    Sleep: Good Estimated Sleeping Duration (Last 24 Hours): 7.00-8.00 hours  Appetite:  Good  Current Medications: Current Facility-Administered Medications  Medication Dose Route Frequency Provider Last Rate Last Admin   acetaminophen  (TYLENOL ) tablet 650 mg  650 mg Oral Q6H PRN Butler, Laura N, MD       alum & mag hydroxide-simeth (MAALOX/MYLANTA) 200-200-20 MG/5ML suspension 30 mL  30 mL Oral Q4H PRN Towana Leita SAILOR, MD       ARIPiprazole  (ABILIFY ) tablet 20 mg  20 mg Oral Daily Towana Leita SAILOR, MD   20 mg at 05/30/24 9187   haloperidol  (HALDOL ) tablet 5 mg  5 mg Oral TID PRN Towana Leita SAILOR, MD       And   diphenhydrAMINE  (BENADRYL ) capsule 50 mg  50 mg Oral TID PRN Towana Leita SAILOR, MD       haloperidol  lactate (HALDOL ) injection 10 mg  10 mg Intramuscular TID PRN White, Patrice L, NP       And   diphenhydrAMINE   (BENADRYL ) injection 50 mg  50 mg Intramuscular TID PRN White, Patrice L, NP       And   LORazepam  (ATIVAN ) injection 2 mg  2 mg Intramuscular TID PRN White, Patrice L, NP       haloperidol  lactate (HALDOL ) injection 5 mg  5 mg Intramuscular TID PRN Towana Leita SAILOR, MD       And   diphenhydrAMINE  (BENADRYL ) injection 50 mg  50 mg Intramuscular TID PRN Towana Leita SAILOR, MD       And   LORazepam  (ATIVAN ) injection 2 mg  2 mg Intramuscular TID PRN Towana Leita SAILOR, MD       hydrOXYzine  (ATARAX ) tablet 25 mg  25 mg Oral TID PRN Towana Leita  N, MD   25 mg at 05/29/24 2134   magnesium  hydroxide (MILK OF MAGNESIA) suspension 30 mL  30 mL Oral Daily PRN Butler, Laura N, MD   30 mL at 05/25/24 0848   nicotine  polacrilex (NICORETTE ) gum 2 mg  2 mg Oral PRN Butler, Laura N, MD   2 mg at 05/30/24 0813   QUEtiapine  (SEROQUEL ) tablet 50 mg  50 mg Oral QHS Butler, Laura N, MD   50 mg at 05/29/24 2134   traZODone  (DESYREL ) tablet 50 mg  50 mg Oral QHS PRN White, Wyline CROME, NP       Lab Results: No results found for this or any previous visit (from the past 48 hours).  Blood Alcohol level:  Lab Results  Component Value Date   ETH <15 05/23/2024   ETH 229 (H) 11/13/2017   Metabolic Disorder Labs: Lab Results  Component Value Date   HGBA1C 4.7 (L) 05/25/2024   MPG 88.19 05/25/2024   No results found for: PROLACTIN Lab Results  Component Value Date   CHOL 191 05/25/2024   TRIG 83 05/25/2024   HDL 67 05/25/2024   CHOLHDL 2.9 05/25/2024   VLDL 17 05/25/2024   LDLCALC 108 (H) 05/25/2024   Physical Findings: AIMS:  ,  ,  ,  ,  ,  ,   CIWA:    COWS:     Musculoskeletal: Strength & Muscle Tone: within normal limits Gait & Station: normal Patient leans: N/A  Psychiatric Specialty Exam:  Presentation  General Appearance: Appropriate for Environment; Casual  Eye Contact:Good  Speech:Clear and Coherent; Normal Rate  Speech Volume:Normal  Handedness:Right  Mood and Affect   Mood:Euthymic  Affect:Appropriate; Congruent  Thought Process  Thought Processes:Coherent; Goal Directed  Descriptions of Associations:Intact  Orientation:Full (Time, Place and Person)  Thought Content:Logical  History of Schizophrenia/Schizoaffective disorder:No  Duration of Psychotic Symptoms:N/A  Hallucinations:Hallucinations: None  Ideas of Reference:None  Suicidal Thoughts:Suicidal Thoughts: No  Homicidal Thoughts:Homicidal Thoughts: No  Sensorium  Memory:Immediate Good; Recent Good  Judgment:Fair  Insight:Fair  Executive Functions  Concentration:Good  Attention Span:Good  Recall:Fair  Fund of Knowledge:Good  Language:Good  Psychomotor Activity  Psychomotor Activity:Psychomotor Activity: Normal  Assets  Assets:Communication Skills; Desire for Improvement; Physical Health; Resilience  Sleep  Sleep:Sleep: Good Number of Hours of Sleep: 8.5  Physical Exam: Physical Exam Vitals and nursing note reviewed.  Constitutional:      General: She is not in acute distress.    Appearance: Normal appearance. She is normal weight. She is not ill-appearing or toxic-appearing.  HENT:     Head: Normocephalic and atraumatic.     Right Ear: External ear normal.     Left Ear: External ear normal.     Mouth/Throat:     Mouth: Mucous membranes are moist.     Pharynx: Oropharynx is clear.  Eyes:     Extraocular Movements: Extraocular movements intact.  Cardiovascular:     Rate and Rhythm: Normal rate.     Pulses: Normal pulses.  Pulmonary:     Effort: Pulmonary effort is normal. No respiratory distress.  Musculoskeletal:        General: Normal range of motion.     Cervical back: Normal range of motion.  Skin:    General: Skin is dry.  Neurological:     General: No focal deficit present.     Mental Status: She is alert and oriented to person, place, and time.  Psychiatric:        Mood and Affect:  Mood normal.        Behavior: Behavior normal.     Review of Systems  Constitutional:  Negative for chills and fever.  HENT:  Negative for sore throat.   Eyes:  Negative for blurred vision.  Respiratory:  Negative for cough and shortness of breath.   Cardiovascular:  Negative for chest pain and palpitations.  Gastrointestinal:  Negative for abdominal pain, constipation, diarrhea, heartburn, nausea and vomiting.  Genitourinary:  Negative for dysuria.  Musculoskeletal:  Negative for falls.  Skin:  Negative for itching and rash.  Neurological:  Negative for dizziness, weakness and headaches.  Endo/Heme/Allergies: Negative.   Psychiatric/Behavioral:  Positive for depression. Negative for hallucinations and suicidal ideas. The patient is nervous/anxious.    Blood pressure 100/61, pulse 92, temperature 98.5 F (36.9 C), temperature source Oral, resp. rate 18, height 5' 3 (1.6 m), weight 60.8 kg, SpO2 99%. Body mass index is 23.74 kg/m.  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 She is admitted following suicide attempt via OD on home medications (unknown amount of gabapentin and seroquel ) as well as crack cocaine.   Angel Ortiz is attending therapeutic milieu and unit group activities today and interacting with the peers. She did respond well to the motivational interview today.  We continue to pursue SA-IOP at [facility] but patient prefers and appreciate Social Works assistance with this.  We will not make any changes to her medications at this time.  We will continue to monitor.   Unspecified mood disorder -Substance induced depression vs substance induced bipolar vs MDD vs bipolar -Continue Abilify  20 mg daily for mood stability -Continue Seroquel  50 mg  QHS for sleep  -Continue Agitation Protocol: Haldol /Ativan /Benadryl   Substance use concerns  -Severe cocaine use:             -motivational interviewing             -recommend bridging directly to rehab  Nicotine  Dependence: -Continue Nicotine  Gum 2 mg PRN -Continue PRN's: Tylenol , Maalox, Atarax , Milk of Magnesia, Trazodone   Labs 05/24/24 -CMP and CBC grossly WNL (mild left shift, WBC 12) -UDS positive for cocaine -Pregnancy test negative    05/25/24 -A1c 4.7 -Lipid panel with mildly elevated LDL (108) other parameters WNL -TSH WNL  --  The risks/benefits/side-effects/alternatives to medications were discussed in detail with the patient and time was given for questions. The patient consents to medication trials.               -- Encouraged patient to participate in unit milieu and in scheduled group therapies              -- Short Term Goals: Ability to identify changes in lifestyle to reduce recurrence of condition will improve, Ability to verbalize feelings will improve, Ability to disclose and discuss suicidal ideas, Ability to demonstrate self-control will improve, Ability to identify and develop effective coping behaviors will improve, Ability to maintain clinical measurements within normal limits will improve, Compliance with prescribed medications will improve, and Ability to identify triggers associated with substance abuse/mental health issues will improve             -- Long Term Goals: Improvement in symptoms so as ready for discharge  Safety and Monitoring:             --  Voluntary admission to inpatient psychiatric unit for safety, stabilization and treatment             -- Daily contact with patient to assess and evaluate symptoms and progress in treatment             -- Patient's case to be discussed in multi-disciplinary team meeting             -- Observation Level : q15 minute checks             -- Vital signs:  q12 hours             -- Precautions: suicide,  elopement, and assault  Discharge Planning:              -- Social work and case management to assist with discharge planning and identification of hospital follow-up needs prior to discharge             -- Estimated LOS: 2-3 more days             -- Discharge Concerns: Need to establish a safety plan; Medication compliance and effectiveness             -- Discharge Goals: Return home with outpatient referrals for mental health follow-up including medication management/psychotherapy  Ellouise JAYSON Azure, FNP 05/30/2024, 1:44 PM Patient ID: Angel Ortiz, female   DOB: December 17, 1985, 38 y.o.   MRN: [id number] Patient ID: Angel Ortiz, female   DOB: Jan 07, 1986, 38 y.o.   MRN: [id number] Patient ID: Angel Ortiz, female   DOB: [birth year], 38 y.o.   MRN: [id number]

## 2024-05-30 NOTE — BH IP Treatment Plan (Signed)
 Interdisciplinary Treatment and Diagnostic Plan Update  05/30/2024 Time of Session: 11:30 AM - UPDATE LENAH MESSENGER MRN: [id number]  Principal Diagnosis: Cocaine use disorder, severe, dependence (HCC)  Secondary Diagnoses: Principal Problem:   Cocaine use disorder, severe, dependence (HCC) Active Problems:   Unspecified mood (affective) disorder   MDD (major depressive disorder), severe (HCC)   Current Medications:  Current Facility-Administered Medications  Medication Dose Route Frequency Provider Last Rate Last Admin   acetaminophen  (TYLENOL ) tablet 650 mg  650 mg Oral Q6H PRN Butler, Laura N, MD       alum & mag hydroxide-simeth (MAALOX/MYLANTA) 200-200-20 MG/5ML suspension 30 mL  30 mL Oral Q4H PRN Towana Leita SAILOR, MD       ARIPiprazole  (ABILIFY ) tablet 20 mg  20 mg Oral Daily Butler, Laura N, MD   20 mg at 05/30/24 9187   haloperidol  (HALDOL ) tablet 5 mg  5 mg Oral TID PRN Towana Leita SAILOR, MD       And   diphenhydrAMINE  (BENADRYL ) capsule 50 mg  50 mg Oral TID PRN Towana Leita SAILOR, MD       haloperidol  lactate (HALDOL ) injection 10 mg  10 mg Intramuscular TID PRN White, Patrice L, NP       And   diphenhydrAMINE  (BENADRYL ) injection 50 mg  50 mg Intramuscular TID PRN White, Patrice L, NP       And   LORazepam  (ATIVAN ) injection 2 mg  2 mg Intramuscular TID PRN White, Patrice L, NP       haloperidol  lactate (HALDOL ) injection 5 mg  5 mg Intramuscular TID PRN Towana Leita SAILOR, MD       And   diphenhydrAMINE  (BENADRYL ) injection 50 mg  50 mg Intramuscular TID PRN Towana Leita SAILOR, MD       And   LORazepam  (ATIVAN ) injection 2 mg  2 mg Intramuscular TID PRN Towana Leita SAILOR, MD       hydrOXYzine  (ATARAX ) tablet 25 mg  25 mg Oral TID PRN Towana Leita SAILOR, MD   25 mg at 05/29/24 2134   magnesium  hydroxide (MILK OF MAGNESIA) suspension 30 mL  30 mL Oral Daily PRN Towana Leita SAILOR, MD   30 mL at 05/25/24 0848   nicotine  polacrilex (NICORETTE ) gum 2 mg  2 mg Oral PRN Towana Leita SAILOR, MD   2  mg at 05/30/24 1609   QUEtiapine  (SEROQUEL ) tablet 50 mg  50 mg Oral QHS Towana Leita SAILOR, MD   50 mg at 05/29/24 2134   traZODone  (DESYREL ) tablet 50 mg  50 mg Oral QHS PRN White, Patrice L, NP       PTA Medications: Medications Prior to Admission  Medication Sig Dispense Refill Last Dose/Taking   amitriptyline (ELAVIL) 25 MG tablet Take 25 mg by mouth at bedtime.   Taking   ARIPiprazole  (ABILIFY ) 15 MG tablet Take 15 mg by mouth every morning.   Taking   gabapentin (NEURONTIN) 100 MG capsule Take 200 mg by mouth at bedtime.   Taking    Patient Stressors: Occupational concerns   Substance abuse    Patient Strengths: Printmaker for treatment/growth  Physical Health  Supportive family/friends  Work skills   Treatment Modalities: Medication Management, Group therapy, Case management,  1 to 1 session with clinician, Psychoeducation, Recreational therapy.   Physician Treatment Plan for Primary Diagnosis: Cocaine use disorder, severe, dependence (HCC) Long Term Goal(s):     Short Term Goals:    Medication Management: Evaluate patient's response,  side effects, and tolerance of medication regimen.  Therapeutic Interventions: 1 to 1 sessions, Unit Group sessions and Medication administration.  Evaluation of Outcomes: Progressing  Physician Treatment Plan for Secondary Diagnosis: Principal Problem:   Cocaine use disorder, severe, dependence (HCC) Active Problems:   Unspecified mood (affective) disorder   MDD (major depressive disorder), severe (HCC)  Long Term Goal(s):     Short Term Goals:       Medication Management: Evaluate patient's response, side effects, and tolerance of medication regimen.  Therapeutic Interventions: 1 to 1 sessions, Unit Group sessions and Medication administration.  Evaluation of Outcomes: Progressing   RN Treatment Plan for Primary Diagnosis: Cocaine use disorder, severe, dependence (HCC) Long Term Goal(s): Knowledge of  disease and therapeutic regimen to maintain health will improve  Short Term Goals: Ability to remain free from injury will improve, Ability to verbalize frustration and anger appropriately will improve, Ability to verbalize feelings will improve, and Ability to disclose and discuss suicidal ideas  Medication Management: RN will administer medications as ordered by provider, will assess and evaluate patient's response and provide education to patient for prescribed medication. RN will report any adverse and/or side effects to prescribing provider.  Therapeutic Interventions: 1 on 1 counseling sessions, Psychoeducation, Medication administration, Evaluate responses to treatment, Monitor vital signs and CBGs as ordered, Perform/monitor CIWA, COWS, AIMS and Fall Risk screenings as ordered, Perform wound care treatments as ordered.  Evaluation of Outcomes: Progressing   LCSW Treatment Plan for Primary Diagnosis: Cocaine use disorder, severe, dependence (HCC) Long Term Goal(s): Safe transition to appropriate next level of care at discharge, Engage patient in therapeutic group addressing interpersonal concerns.  Short Term Goals: Engage patient in aftercare planning with referrals and resources, Increase ability to appropriately verbalize feelings, Facilitate acceptance of mental health diagnosis and concerns, and Identify triggers associated with mental health/substance abuse issues  Therapeutic Interventions: Assess for all discharge needs, 1 to 1 time with Social worker, Explore available resources and support systems, Assess for adequacy in community support network, Educate family and significant other(s) on suicide prevention, Complete Psychosocial Assessment, Interpersonal group therapy.  Evaluation of Outcomes: Progressing   Progress in Treatment: Attending groups: No. Participating in groups: No. Taking medication as prescribed: Yes. Toleration medication: Yes. Family/Significant other  contact made: Yes, contacted:  Annalissa Murphey (mother) [phone] Patient understands diagnosis: Yes. Discussing patient identified problems/goals with staff: Yes. Medical problems stabilized or resolved: Yes. Denies suicidal/homicidal ideation: Yes. Issues/concerns per patient self-inventory: No. None reported.   New problem(s) identified: No, Describe:  None identified.   New Short Term/Long Term Goal(s): detox, medication management for mood stabilization; elimination of SI thoughts; development of comprehensive mental wellness/sobriety plan   Patient Goals: Going home. I'd be open to SAIOP.   Discharge Plan or Barriers: Patient recently admitted. CSW will continue to follow and assess for appropriate referrals and possible discharge planning.     Reason for Continuation of Hospitalization: Depression Medication stabilization Withdrawal symptoms   Estimated Length of Stay: 1 - 2 days  Last 3 [place] Suicide Severity Risk Score: Flowsheet Row [facility] (Current[facility] from [facility]23/2024 in [facility] Department at [facility]  C-SSRS RISK CATEGORY High Risk High Risk    Last [facility] 2/9 Scores:     No data to display          Scribe for Treatment Team: Matalyn Nawaz O Lionel Woodberry, LCSWA 05/30/2024 5:19 PM

## 2024-05-30 NOTE — Group Note (Signed)
 Date:  05/30/2024 Time:  8:41 PM  Group Topic/Focus:  Neurotransmitters and their role in moods and behaviors. How psych meds affect them and for which symptoms. The gut flora's role with neurotransmitters.   Participation Level:  Active  Participation Quality:  Attentive  Affect:  Appropriate  Cognitive:  Appropriate  Insight: Appropriate  Engagement in Group:  Engaged  Modes of Intervention:  Discussion and Education  Additional Comments:    Juliene CHRISTELLA Huddle 05/30/2024, 8:41 PM

## 2024-05-30 NOTE — Progress Notes (Signed)
" °   05/30/24 0800  Psych Admission Type (Psych Patients Only)  Admission Status Voluntary  Psychosocial Assessment  Patient Complaints None  Eye Contact Fair  Facial Expression Flat  Affect Sad  Speech Logical/coherent  Interaction Guarded  Motor Activity Other (Comment) (WDL)  Appearance/Hygiene Unremarkable  Behavior Characteristics Cooperative;Appropriate to situation  Mood Pleasant  Aggressive Behavior  Targets Other (Comment) (na)  Type of Behavior Other (Comment) (na)  Effect No apparent injury  Thought Process  Coherency WDL  Content WDL  Delusions None reported or observed  Perception WDL  Hallucination None reported or observed  Judgment Impaired  Confusion None  Danger to Self  Current suicidal ideation? Denies  Agreement Not to Harm Self Yes  Description of Agreement verbal  Danger to Others  Danger to Others None reported or observed    "

## 2024-05-30 NOTE — Plan of Care (Signed)
  Problem: Education: Goal: Knowledge of [facility] Education information/materials will improve Outcome: Progressing   Problem: Education: Goal: Emotional status will improve Outcome: Progressing   Problem: Education: Goal: Mental status will improve Outcome: Progressing   

## 2024-05-30 NOTE — Group Note (Signed)
 Date:  05/30/2024 Time:  9:58 AM  Group Topic/Focus:  RECREATION THERAPY (KEEP IT GOING VOLLYBALL) Purpose: The Keep It Going Volleyball session was organized as a recreational group activity aimed at promoting physical movement, social interaction, and team-building in a supportive and structured environment. The activity serves as a tool to engage participants in an enjoyable and non-competitive setting, encouraging positive mental health through physical exercise, communication, and group participation.  Objectives:  Enhance Socialization: Provide a platform for participants to engage with peers in a relaxed and enjoyable setting, promoting healthy interpersonal interactions.  Promote Physical Activity: Encourage movement and physical exercise, which has been shown to help improve mood, reduce stress, and increase overall well-being.  Build Teamwork and Cooperation: Jerrye a agricultural engineer and cooperation among participants, helping them work together toward a common goal in a non-competitive manner.  Boost Mental Well-Being: Provide a low-pressure environment where participants can engage in fun and rewarding activity, which contributes to reducing anxiety and improving self-esteem.  Increase Engagement: Create an opportunity for participants to be actively engaged and present, reducing feelings of isolation and promoting a sense of belonging.  Activity Overview: The session involved a relaxed, non-competitive game of volleyball where participants worked together in teams. The focus was on having fun, training and development officer, and encouraging active participation. Rules were kept simple to ensure that everyone could take part regardless of skill level.  Facilitators provided gentle guidance and encouragement, ensuring that the activity remained inclusive and supportive. Emphasis was placed on fostering a positive atmosphere, with participants encouraged to celebrate each others efforts and  accomplishments, no matter how small.  Outcome/Results:  Participants showed increased engagement and enthusiasm throughout the activity.  Positive interactions were observed between participants, with moments of laughter, encouragement, and team bonding.  Physical activity appeared to contribute to an improvement in overall mood and energy levels.  Several participants expressed enjoyment and a desire to continue participating in future recreational activities.  Recommendations:  Continue incorporating recreational activities like volleyball into the schedule to promote group cohesion and mental well-being.  Explore further team-based or movement-focused activities to enhance social skills and emotional regulation.    Participation Level:  Did Not Attend   Angel Ortiz 05/30/2024, 9:58 AM

## 2024-05-30 NOTE — Group Note (Signed)
 Recreation Therapy Group Note   Group Topic:Leisure Education  Group Date: 05/30/2024 Start Time: 0930 End Time: 1000 Facilitators: Ramesses Crampton-McCall, LRT,CTRS Location: [address] Dayroom   Group Topic: Leisure Education   Goal Area(s) Addresses:  Patient will successfully identify positive leisure and recreation activities.  Patient will acknowledge benefits of participation in healthy leisure activities post discharge.  Patient will actively work with peers toward a shared goal.   Behavioral Response:    Intervention: Cooperative Group Game    Activity: Keep It Contractor. In circle, patients were timed as they tossed a beach ball to each to each other to determine how long they could keep the ball in motion. If the ball came to a complete stop, the time would reset and the game started over. Patients were trying to beat the longest time of previous groups.    Education: Teacher, English As A Foreign Language, Leisure as Merchant Navy Officer, Programmer, Applications, Building Control Surveyor   Education Outcome: Acknowledges education/In group clarification offered/Needs additional education   Affect/Mood: N/A   Participation Level: Did not attend    Clinical Observations/Individualized Feedback:      Plan: Continue to engage patient in RT group sessions 2-3x/week.   Angel Ortiz, LRT,CTRS 05/30/2024 12:05 PM

## 2024-05-30 NOTE — Group Note (Signed)
 Date:  05/30/2024 Time:  9:38 AM  Group Topic/Focus:  Goals Group:   The focus of this group is to help patients establish daily goals to achieve during treatment and discuss how the patient can incorporate goal setting into their daily lives to aide in recovery. Orientation:   The focus of this group is to educate the patient on the purpose and policies of crisis stabilization and provide a format to answer questions about their admission.  The group details unit policies and expectations of patients while admitted.    Participation Level:  Did Not Attend   Mose Colaizzi 05/30/2024, 9:38 AM

## 2024-05-31 MED ORDER — ARIPIPRAZOLE 20 MG PO TABS: 20.0000 mg | ORAL_TABLET | Freq: Every day | ORAL | 0 refills | Status: AC

## 2024-05-31 MED ORDER — HYDROXYZINE HCL 25 MG PO TABS: 25.0000 mg | ORAL_TABLET | Freq: Three times a day (TID) | ORAL | 0 refills | Status: AC | PRN

## 2024-05-31 MED ORDER — NICOTINE POLACRILEX 2 MG MT GUM: 2.0000 mg | CHEWING_GUM | OROMUCOSAL | 0 refills | Status: AC | PRN

## 2024-05-31 MED ORDER — QUETIAPINE FUMARATE 50 MG PO TABS: 50.0000 mg | ORAL_TABLET | Freq: Every day | ORAL | 0 refills | Status: AC

## 2024-05-31 NOTE — Progress Notes (Signed)
" °   05/30/24 2140  Psych Admission Type (Psych Patients Only)  Admission Status Voluntary  Psychosocial Assessment  Patient Complaints None  Eye Contact Fair  Facial Expression Flat  Affect Sad  Speech Logical/coherent  Interaction Guarded;Minimal  Motor Activity Other (Comment) (WDL)  Appearance/Hygiene Unremarkable  Behavior Characteristics Cooperative;Appropriate to situation  Mood Pleasant  Thought Process  Coherency WDL  Content WDL  Delusions None reported or observed  Perception WDL  Hallucination None reported or observed  Judgment Impaired  Confusion None  Danger to Self  Current suicidal ideation? Denies  Danger to Others  Danger to Others None reported or observed    "

## 2024-05-31 NOTE — Progress Notes (Signed)
(  Sleep Hours) - 7.5  (Any PRNs that were needed, meds refused, or side effects to meds)- Hydroxyzine   (Any disturbances and when (visitation, over night)-  none  (Concerns raised by the patient)- none  (SI/HI/AVH)- denies

## 2024-05-31 NOTE — Progress Notes (Signed)
 Patient verbalizes readiness for discharge. All patient belongings returned to patient. Discharge instructions read and discussed with patient (appointments, medications, resources). Patient expressed gratitude for care provided. Patient discharged to lobby at 1005 where her ride was waiting.

## 2024-05-31 NOTE — Plan of Care (Signed)
   Problem: Education: Goal: Emotional status will improve Outcome: Progressing Goal: Mental status will improve Outcome: Progressing   Problem: Activity: Goal: Interest or engagement in activities will improve Outcome: Progressing

## 2024-05-31 NOTE — Group Note (Signed)
 Date:  05/31/2024 Time:  10:19 AM  Group Topic/Focus: Goals group Patients began by introducing themselves and sharing their favorite food as an research scientist (life sciences) activity. After that, they were given SMART goal worksheets to complete. Examples of SMART goals were provided, and the patients had time to fill out their worksheets. Once finished, each patient shared their icebreaker responses and SMART goals with the group, encouraging social interaction and goal-setting.   Participation Level:  Active  Participation Quality:  Appropriate, Attentive, and Sharing  Affect:  Appropriate  Cognitive:  Alert and Appropriate  Insight: Good and Improving  Engagement in Group:  Engaged and Improving  Modes of Intervention:  Discussion, Exploration, Socialization, and Support  Additional Comments:  Pt attended and actively participated in goals group  Kristi HERO [facility] 05/31/2024, 10:19 AM

## 2024-05-31 NOTE — [facility] Suicide Risk Assessment (Signed)
 [facility] Discharge Suicide Risk Assessment   Principal Problem: Cocaine use disorder, severe, dependence (HCC) Discharge Diagnoses: Principal Problem:   Cocaine use disorder, severe, dependence (HCC) Active Problems:   Unspecified mood (affective) disorder   MDD (major depressive disorder), severe (HCC)  During the patient's hospitalization, patient had extensive initial psychiatric evaluation, and follow-up psychiatric evaluations every day.  Psychiatric diagnoses provided upon initial assessment:  Cocaine use disorder, severe, dependence (HCC) Active Problems:   Unspecified mood (affective) disorder   MDD (major depressive disorder), severe (HCC)  Patient's psychiatric medications were adjusted on admission: Restarted home Abilify .  Started Seroquel  at night.  Stopped home Gabapentin and Amitriptyline.   During the hospitalization, other adjustments were made to the patient's psychiatric medication regimen: Titrated Abilify .  Gradually, patient started adjusting to milieu.   Patient's care was discussed during the interdisciplinary team meeting every day during the hospitalization.  The patient is not having side effects to prescribed psychiatric medication.  The patient reports their target psychiatric symptoms of depression and anxiety responded well to the psychiatric medications, and the patient reports overall benefit other psychiatric hospitalization. Supportive psychotherapy was provided to the patient. The patient also participated in regular group therapy while admitted.   Labs were reviewed with the patient, and abnormal results were discussed with the patient.  The patient denied having suicidal thoughts more than 48 hours prior to discharge.  Patient denies having homicidal thoughts.  Patient denies having auditory hallucinations.  Patient denies any visual hallucinations.  Patient denies having paranoid thoughts.  The patient is able to verbalize their individual safety  plan to this provider.  It is recommended to the patient to continue psychiatric medications as prescribed, after discharge from the hospital.    It is recommended to the patient to follow up with your outpatient psychiatric provider and PCP.  Discussed with the patient, the impact of alcohol, drugs, tobacco have been there overall psychiatric and medical wellbeing, and total abstinence from substance use was recommended the patient.  Total Time spent with patient: 20 minutes  Musculoskeletal: Strength & Muscle Tone: within normal limits Gait & Station: normal Patient leans: N/A  Psychiatric Specialty Exam  Presentation  General Appearance:  Appropriate for Environment; Casual  Eye Contact: Good  Speech: Clear and Coherent; Normal Rate  Speech Volume: Normal  Handedness: Right   Mood and Affect  Mood: -- (happy)  Duration of Depression Symptoms: N/A  Affect: Appropriate; Congruent   Thought Process  Thought Processes: Coherent; Goal Directed  Descriptions of Associations:Intact  Orientation:Full (Time, Place and Person)  Thought Content:Logical; WDL  History of Schizophrenia/Schizoaffective disorder:No  Duration of Psychotic Symptoms:N/A  Hallucinations:Hallucinations: None  Ideas of Reference:None  Suicidal Thoughts:Suicidal Thoughts: No  Homicidal Thoughts:Homicidal Thoughts: No   Sensorium  Memory: Immediate Good; Recent Good  Judgment: Good  Insight: Good   Executive Functions  Concentration: Good  Attention Span: Good  Recall: Good  Fund of Knowledge: Good  Language: Good   Psychomotor Activity  Psychomotor Activity: Psychomotor Activity: Normal   Assets  Assets: Communication Skills; Desire for Improvement; Resilience; Physical Health   Sleep  Sleep: Sleep: Good  Estimated Sleeping Duration (Last 24 Hours): 6.50-7.75 hours  Physical Exam: Physical Exam Vitals and nursing note reviewed.   Constitutional:      General: She is not in acute distress.    Appearance: Normal appearance. She is normal weight. She is not ill-appearing or toxic-appearing.  HENT:     Head: Normocephalic and atraumatic.  Pulmonary:  Effort: Pulmonary effort is normal.  Musculoskeletal:        General: Normal range of motion.  Neurological:     General: No focal deficit present.     Mental Status: She is alert.    Review of Systems  Respiratory:  Negative for cough and shortness of breath.   Cardiovascular:  Negative for chest pain.  Gastrointestinal:  Negative for abdominal pain, constipation, diarrhea, nausea and vomiting.  Neurological:  Negative for dizziness, weakness and headaches.  Psychiatric/Behavioral:  Negative for depression, hallucinations and suicidal ideas. The patient is not nervous/anxious.    Blood pressure 99/62, pulse 88, temperature 98.4 F (36.9 C), temperature source Oral, resp. rate 18, height 5' 3 (1.6 m), weight 60.8 kg, SpO2 100%. Body mass index is 23.74 kg/m.  Mental Status Per Nursing Assessment::   On Admission:  Self-harm behaviors  Demographic Factors:  Unemployed  Loss Factors: NA  Historical Factors: Prior suicide attempts  Risk Reduction Factors:   Responsible for children under 43 years of age, Living with another person, especially a relative, Positive social support, Positive therapeutic relationship, and Positive coping skills or problem solving skills  Continued Clinical Symptoms:  Alcohol/Substance Abuse/Dependencies More than one psychiatric diagnosis Previous Psychiatric Diagnoses and Treatments Medical Diagnoses and Treatments/Surgeries  Cognitive Features That Contribute To Risk:  None    Suicide Risk:  Minimal: No identifiable suicidal ideation.  Patients presenting with no risk factors but with morbid ruminations; may be classified as minimal risk based on the severity of the depressive symptoms.  However, as she does have a  history of a Prior Suicide Attempt, there is some chronic risk present.   Follow-up Information     [facility]. Go on 06/11/2024.   Why: * You must call [facility] DSS office at [phone] to switch your medicaid to [facility], if you intend to stay in [facility], prior to your appointment. *   You have a hospital follow up appointment scheduled on 06/11/24 at 10:00 am .  The appointment will be held in person.  Following this appointment you will be scheduled for a clinical assessment to obtain therapy and medication management services.  You may attend substance use group therapy weekly until you can get into the substance abuse intensive outpatient therapy program. Contact information: [address] [place] [address] [phone]         Inc, [facility]. Go on 05/29/2024.   Why: You may go to this provider on 05/29/24 at 8:30 am for substance abuse intensive outpatient therapy services.  They accept all medicaid.  You may also go Monday through Friday, from 8:30 to 3:00 pm for your initial assessment.   Group therapy and medication management services are also available. Contact information: [address] [place] [address] [phone]                 Plan Of Care/Follow-up recommendations:  Activity: as tolerated  Diet: heart healthy  Other: -Follow-up with your outpatient psychiatric provider -instructions on appointment date, time, and address (location) are provided to you in discharge paperwork.  -Take your psychiatric medications as prescribed at discharge - instructions are provided to you in the discharge paperwork  -Follow-up with outpatient primary care doctor and other specialists -for management of chronic medical disease, including: Routine Care.  Continued care for Substance Abuse.  -Testing: Follow-up with outpatient provider for abnormal lab results: None  -Recommend abstinence from alcohol,  tobacco, and other illicit  drug use at discharge.   -If your psychiatric symptoms recur, worsen, or if you have side effects to your psychiatric medications, call your outpatient psychiatric provider, [phone] or go to the nearest emergency department.  -If suicidal thoughts recur, call your outpatient psychiatric provider, 911, [phone] or go to the nearest emergency department.   Marsa GORMAN Rosser, DO 05/31/2024, 8:23 AM

## 2024-05-31 NOTE — Progress Notes (Signed)
" °  [facility] Adult Case Management Discharge Plan :  Will you be returning to the same living situation after discharge:  Yes,  with family At discharge, do you have transportation home?: Yes,  Friend Do you have the ability to pay for your medications: Yes,  patient has medical insurance  Release of information consent forms completed and in the chart;  Patient's signature needed at discharge.  Patient to Follow up at:  Follow-up Information     [facility], [facility]. Go on 06/11/2024.   Why: * You must call [facility] DSS office at [phone] to switch your medicaid to [facility], if you intend to stay in [facility], prior to your appointment. *   You have a hospital follow up appointment scheduled on 06/11/24 at 10:00 am .  The appointment will be held in person.  Following this appointment you will be scheduled for a clinical assessment to obtain therapy and medication management services.  You may attend substance use group therapy weekly until you can get into the substance abuse intensive outpatient therapy program. Contact information: [address] [place] [address] [phone]         Inc, [facility]. Go on 05/29/2024.   Why: You may go to this provider on 05/29/24 at 8:30 am for substance abuse intensive outpatient therapy services.  They accept all medicaid.  You may also go Monday through Friday, from 8:30 to 3:00 pm for your initial assessment.   Group therapy and medication management services are also available. Contact information: [address] [place] [address] [phone]                 Next level of care provider has access to [facility] Link:no  Safety Planning and Suicide Prevention discussed: Yes,  Completed with mother on 05/28/24     Has patient been referred to the Quitline?: Patient refused referral for treatment  Patient has been referred for addiction treatment: Yes, the patient will follow up  with an outpatient provider for substance use disorder. Psychiatrist/APP: appointment made and Therapist: appointment made  Angel JONETTA Kerns, LCSW 05/31/2024, 9:59 AM "

## 2024-05-31 NOTE — Discharge Summary (Signed)
 " Physician Discharge Summary Note  Patient:  Angel Ortiz is an 38 y.o., female MRN:  [id number] DOB:  [birth year] Patient phone:  [phone] (home)  Patient address:   [address] [place] [address],  Total Time spent with patient: 20 minutes  Date of Admission:  05/24/2024 Date of Discharge: 05/31/2024  Reason for Admission:   The patient is a 38 y.o. 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 Patient presented to the ED after being found down with crack pipe next to her. She had additionally overdosed on unknown amounts of home medication including seroquel  and gabapentin as a suicide attempt. Transferred voluntarily to [facility] for further treatment after medical clearance.   On interview today the patient reports that she is not good. Reports feeling depressed over the past 2-3 months. Not able to disclose any recent stressors today. Does report the overdose was a suicide attempt, denies active SI now. Reports she takes Abilify  outpatient and agrees that it is helpful, states she was taking it up until 2 days ago as well as amitriptyline and gabapentin, which she requests for sleep tonight. Trazodone  has not been helpful. Does agree when specifically asked (via nodding) that cocaine has been a problem in her life and that she started using at age 89.     Principal Problem: Cocaine use disorder, severe, dependence (HCC) Discharge Diagnoses: Principal Problem:   Cocaine use disorder, severe, dependence (HCC) Active Problems:   Unspecified mood (affective) disorder   MDD (major depressive disorder), severe (HCC)   Past Psychiatric History:  Unspecified Mood Disorder and Severe Cocaine Use Disorder, and 1 Prior Psychiatric Hospitalization ([facility] 2009?)   Past Medical  History:  Past Medical History:  Diagnosis Date   Addiction [facility])     Past Surgical History:  Procedure Laterality Date   cesarean section     x3   TONSILLECTOMY     Family History: History reviewed. No pertinent family history. Family Psychiatric  History:  None Reported   Social History:  Social History   Substance and Sexual Activity  Alcohol Use Yes   Alcohol/week: 2.0 standard drinks of alcohol   Types: 2 Cans of beer per week   Comment: every other day drinks 2 (24 oz) beets     Social History   Substance and Sexual Activity  Drug Use Yes   Types: Cocaine    Social History   Socioeconomic History   Marital status: Single    Spouse name: Not on file   Number of children: Not on file   Years of education: Not on file   Highest education level: Not on file  Occupational History   Not on file  Tobacco Use   Smoking status: Every Day   Smokeless tobacco: Never  Substance and Sexual Activity   Alcohol use: Yes    Alcohol/week: 2.0 standard drinks of alcohol    Types: 2 Cans of beer per week    Comment: every other day drinks 2 (24 oz) beets   Drug use: Yes    Types: Cocaine   Sexual activity: Yes  Other Topics Concern   Not on file  Social History Narrative   Not on file   Social Drivers of Health   Tobacco Use: High Risk (05/24/2024)   Patient History    Smoking Tobacco  Use: Every Day    Smokeless Tobacco Use: Never    Passive Exposure: Not on file  Financial Resource Strain: Not on file  Food Insecurity: No Food Insecurity (05/24/2024)   Epic    Worried About Programme Researcher, Broadcasting/film/video in the Last Year: Never true    Ran Out of Food in the Last Year: Never true  Transportation Needs: No Transportation Needs (05/24/2024)   Epic    Lack of Transportation (Medical): No    Lack of Transportation (Non-Medical): No  Physical Activity: Not on file  Stress: Not on file  Social Connections: Not on file  Depression (EYV7-0): Not on file  Alcohol Screen:  Low Risk (05/24/2024)   Alcohol Screen    Last Alcohol Screening Score (AUDIT): 5  Housing: Low Risk (05/24/2024)   Epic    Unable to Pay for Housing in the Last Year: No    Number of Times Moved in the Last Year: 0    Homeless in the Last Year: No  Utilities: Not At Risk (05/24/2024)   Epic    Threatened with loss of utilities: No  Health Literacy: Not on file    Hospital Course:   During the patient's hospitalization, patient had extensive initial psychiatric evaluation, and follow-up psychiatric evaluations every day.  Psychiatric diagnoses provided upon initial assessment:  Cocaine use disorder, severe, dependence (HCC) Active Problems:   Unspecified mood (affective) disorder   MDD (major depressive disorder), severe (HCC)  Patient's psychiatric medications were adjusted on admission: Restarted home Abilify . Started Seroquel  at night. Stopped home Gabapentin and Amitriptyline.   During the hospitalization, other adjustments were made to the patient's psychiatric medication regimen: Titrated Abilify .   Patient's care was discussed during the interdisciplinary team meeting every day during the hospitalization.  The patient is not having side effects to prescribed psychiatric medication.  Gradually, patient started adjusting to milieu. The patient was evaluated each day by a clinical provider to ascertain response to treatment. Improvement was noted by the patient's report of decreasing symptoms, improved sleep and appetite, affect, medication tolerance, behavior, and participation in unit programming.  Patient was asked each day to complete a self inventory noting mood, mental status, pain, new symptoms, anxiety and concerns.   Symptoms were reported as significantly decreased or resolved completely by discharge.  The patient reports that their mood is stable.  The patient denied having suicidal thoughts for more than 48 hours prior to discharge.  Patient denies having homicidal  thoughts.  Patient denies having auditory hallucinations.  Patient denies any visual hallucinations or other symptoms of psychosis.  The patient was motivated to continue taking medication with a goal of continued improvement in mental health.   The patient reports their target psychiatric symptoms of depression and anxiety responded well to the psychiatric medications, and the patient reports overall benefit other psychiatric hospitalization. Supportive psychotherapy was provided to the patient. The patient also participated in regular group therapy while hospitalized. Coping skills, problem solving as well as relaxation therapies were also part of the unit programming.  Labs were reviewed with the patient, and abnormal results were discussed with the patient.  The patient is able to verbalize their individual safety plan to this provider.  # It is recommended to the patient to continue psychiatric medications as prescribed, after discharge from the hospital.    # It is recommended to the patient to follow up with your outpatient psychiatric provider and PCP.  # It was discussed with the patient, the impact  of alcohol, drugs, tobacco have been there overall psychiatric and medical wellbeing, and total abstinence from substance use was recommended the patient.ed.  # Prescriptions provided or sent directly to preferred pharmacy at discharge. Patient agreeable to plan. Given opportunity to ask questions. Appears to feel comfortable with discharge.    # In the event of worsening symptoms, the patient is instructed to call the crisis hotline, 911 and or go to the nearest ED for appropriate evaluation and treatment of symptoms. To follow-up with primary care provider for other medical issues, concerns and or health care needs  # Patient was discharged home with a plan to follow up as noted below.    On day of discharge she reports feeling ready to go home.  She reports she is very excited to see  her children.  She reports no side effects to her medications.  She reports her sleep is good.  She reports her appetite is good.  She reports no SI, HI, or AVH.  Discussed with her the importance of taking her medications as prescribed and attending her follow up appointments and she reported understanding.  Discussed with her what to do in the event of a future crisis.  Discussed that she can go to [facility], go to the nearest ED, or call 911 or 988.   She reported understanding and had no concerns.  She was discharged home.    Physical Findings: AIMS: Facial and Oral Movements Muscles of Facial Expression: None Lips and Perioral Area: None Jaw: None Tongue: None,Extremity Movements Upper (arms, wrists, hands, fingers): None Lower (legs, knees, ankles, toes): None, Trunk Movements Neck, shoulders, hips: None, Global Judgements Severity of abnormal movements overall : None Incapacitation due to abnormal movements: None Patient's awareness of abnormal movements: No Awareness,  ,  , AIMS Total Score AIMS Total Score: 0 CIWA:    COWS:     Musculoskeletal: Strength & Muscle Tone: within normal limits Gait & Station: normal Patient leans: N/A   Psychiatric Specialty Exam:  Presentation  General Appearance:  Appropriate for Environment; Casual  Eye Contact: Good  Speech: Clear and Coherent; Normal Rate  Speech Volume: Normal  Handedness: Right   Mood and Affect  Mood: -- (happy)  Affect: Appropriate; Congruent   Thought Process  Thought Processes: Coherent; Goal Directed  Descriptions of Associations:Intact  Orientation:Full (Time, Place and Person)  Thought Content:Logical; WDL  History of Schizophrenia/Schizoaffective disorder:No  Duration of Psychotic Symptoms:N/A  Hallucinations:Hallucinations: None  Ideas of Reference:None  Suicidal Thoughts:Suicidal Thoughts: No  Homicidal Thoughts:Homicidal Thoughts: No   Sensorium  Memory: Immediate Good;  Recent Good  Judgment: Good  Insight: Good   Executive Functions  Concentration: Good  Attention Span: Good  Recall: Good  Fund of Knowledge: Good  Language: Good   Psychomotor Activity  Psychomotor Activity: Psychomotor Activity: Normal   Assets  Assets: Communication Skills; Desire for Improvement; Resilience; Physical Health   Sleep  Sleep: Sleep: Good  Estimated Sleeping Duration (Last 24 Hours): 6.50-7.75 hours   Physical Exam: Physical Exam Vitals and nursing note reviewed.  Constitutional:      General: She is not in acute distress.    Appearance: Normal appearance. She is normal weight. She is not ill-appearing or toxic-appearing.  HENT:     Head: Normocephalic and atraumatic.  Pulmonary:     Effort: Pulmonary effort is normal.  Musculoskeletal:        General: Normal range of motion.  Neurological:     General: No focal deficit present.  Mental Status: She is alert.    Review of Systems  Respiratory:  Negative for cough and shortness of breath.   Cardiovascular:  Negative for chest pain.  Gastrointestinal:  Negative for abdominal pain, constipation, diarrhea, nausea and vomiting.  Neurological:  Negative for dizziness, weakness and headaches.  Psychiatric/Behavioral:  Negative for depression, hallucinations and suicidal ideas. The patient is not nervous/anxious.    Blood pressure 99/62, pulse 88, temperature 98.4 F (36.9 C), temperature source Oral, resp. rate 18, height 5' 3 (1.6 m), weight 60.8 kg, SpO2 100%. Body mass index is 23.74 kg/m.   Tobacco Use History[1] Tobacco Cessation:  A prescription for an FDA-approved tobacco cessation medication provided at discharge   Blood Alcohol level:  Lab Results  Component Value Date   [facility] <15 05/23/2024   ETH 229 (H) 11/13/2017    Metabolic Disorder Labs:  Lab Results  Component Value Date   HGBA1C 4.7 (L) 05/25/2024   MPG 88.19 05/25/2024   No results found for:  PROLACTIN Lab Results  Component Value Date   CHOL 191 05/25/2024   TRIG 83 05/25/2024   HDL 67 05/25/2024   CHOLHDL 2.9 05/25/2024   VLDL 17 05/25/2024   LDLCALC 108 (H) 05/25/2024    See Psychiatric Specialty Exam and Suicide Risk Assessment completed by Attending Physician prior to discharge.  Discharge destination:  Home  Is patient on multiple antipsychotic therapies at discharge:  No   Has Patient had three or more failed trials of antipsychotic monotherapy by history:  No  Recommended Plan for Multiple Antipsychotic Therapies: NA  Discharge Instructions     Increase activity slowly   Complete by: As directed       Allergies as of 05/31/2024   No Known Allergies      Medication List     STOP taking these medications    amitriptyline 25 MG tablet Commonly known as: ELAVIL   gabapentin 100 MG capsule Commonly known as: NEURONTIN       TAKE these medications      Indication  ARIPiprazole  20 MG tablet Commonly known as: ABILIFY  Take 1 tablet (20 mg total) by mouth daily. What changed:  medication strength how much to take when to take this  Indication: Major Depressive Disorder   hydrOXYzine  25 MG tablet Commonly known as: ATARAX  Take 1 tablet (25 mg total) by mouth 3 (three) times daily as needed for anxiety.  Indication: Feeling Anxious   nicotine  polacrilex 2 MG gum Commonly known as: NICORETTE  Take 1 each (2 mg total) by mouth as needed for smoking cessation.  Indication: Nicotine  Addiction   QUEtiapine  50 MG tablet Commonly known as: SEROQUEL  Take 1 tablet (50 mg total) by mouth at bedtime.  Indication: Trouble Sleeping, Major Depressive Disorder        Follow-up Information     [facility]. Go on 06/11/2024.   Why: * You must call [facility] DSS office at [phone] to switch your medicaid to [facility], if you intend to stay in [facility], prior to your appointment. *   You have a hospital  follow up appointment scheduled on 06/11/24 at 10:00 am .  The appointment will be held in person.  Following this appointment you will be scheduled for a clinical assessment to obtain therapy and medication management services.  You may attend substance use group therapy weekly until you can get into the substance abuse intensive outpatient therapy program. Contact information: [address] [place] [address]  [phone]         [facility]. Go on 05/29/2024.   Why: You may go to this provider on 05/29/24 at 8:30 am for substance abuse intensive outpatient therapy services.  They accept all medicaid.  You may also go Monday through Friday, from 8:30 to 3:00 pm for your initial assessment.   Group therapy and medication management services are also available. Contact information: [address] [place] [address] [phone]                 Follow-up recommendations/Comments:   Activity: as tolerated   Diet: heart healthy   Other: -Follow-up with your outpatient psychiatric provider -instructions on appointment date, time, and address (location) are provided to you in discharge paperwork.   -Take your psychiatric medications as prescribed at discharge - instructions are provided to you in the discharge paperwork   -Follow-up with outpatient primary care doctor and other specialists -for management of chronic medical disease, including: Routine Care.  Continued care for Substance Abuse.   -Testing: Follow-up with outpatient provider for abnormal lab results: None   -Recommend abstinence from alcohol, tobacco, and other illicit drug use at discharge.    -If your psychiatric symptoms recur, worsen, or if you have side effects to your psychiatric medications, call your outpatient psychiatric provider, 911, [phone] or go to the nearest emergency department.   -If suicidal thoughts recur, call your outpatient psychiatric provider, 911, 988 or go to the  nearest emergency department.  Signed: Marsa GORMAN Rosser, DO 05/31/2024, 8:23 AM           [1]  Social History Tobacco Use  Smoking Status Every Day  Smokeless Tobacco Never   "
